# Patient Record
Sex: Female | Born: 1980
Health system: Southern US, Community
[De-identification: ages and names within clinical notes are randomized; demographics above are authoritative.]

## PROBLEM LIST (undated history)

## (undated) DIAGNOSIS — J45909 Unspecified asthma, uncomplicated: Secondary | ICD-10-CM

## (undated) DIAGNOSIS — O09519 Supervision of elderly primigravida, unspecified trimester: Secondary | ICD-10-CM

## (undated) DIAGNOSIS — B009 Herpesviral infection, unspecified: Secondary | ICD-10-CM

## (undated) DIAGNOSIS — I2699 Other pulmonary embolism without acute cor pulmonale: Secondary | ICD-10-CM

## (undated) HISTORY — DX: Supervision of elderly primigravida, unspecified trimester: O09.519

## (undated) HISTORY — DX: Unspecified asthma, uncomplicated: J45.909

---

## 2004-09-30 ENCOUNTER — Other Ambulatory Visit: Admission: RE | Admit: 2004-09-30 | Discharge: 2004-09-30 | Payer: Self-pay | Admitting: Obstetrics & Gynecology

## 2005-10-22 ENCOUNTER — Other Ambulatory Visit: Admission: RE | Admit: 2005-10-22 | Discharge: 2005-10-22 | Payer: Self-pay | Admitting: Obstetrics & Gynecology

## 2007-03-06 ENCOUNTER — Ambulatory Visit (HOSPITAL_COMMUNITY): Admission: RE | Admit: 2007-03-06 | Discharge: 2007-03-06 | Payer: Self-pay | Admitting: Gastroenterology

## 2010-02-24 ENCOUNTER — Emergency Department (HOSPITAL_COMMUNITY): Admission: EM | Admit: 2010-02-24 | Discharge: 2010-02-24 | Payer: Self-pay | Admitting: Family Medicine

## 2010-10-04 ENCOUNTER — Encounter: Payer: Self-pay | Admitting: Gastroenterology

## 2011-08-25 ENCOUNTER — Emergency Department (HOSPITAL_COMMUNITY)
Admission: EM | Admit: 2011-08-25 | Discharge: 2011-08-25 | Disposition: A | Discharge status: Home / Self Care | Payer: 59 | Source: Home / Self Care | Attending: Emergency Medicine | Admitting: Emergency Medicine

## 2011-08-25 ENCOUNTER — Encounter: Payer: Self-pay | Admitting: Emergency Medicine

## 2011-08-25 DIAGNOSIS — R6889 Other general symptoms and signs: Secondary | ICD-10-CM

## 2011-08-25 HISTORY — DX: Herpesviral infection, unspecified: B00.9

## 2011-08-25 MED ORDER — FLUTICASONE PROPIONATE 50 MCG/ACT NA SUSP: 2.0000 | Freq: Every day | NASAL | Status: DC

## 2011-08-25 MED ORDER — IBUPROFEN 600 MG PO TABS: 600.0000 mg | ORAL_TABLET | Freq: Four times a day (QID) | ORAL | Status: AC | PRN

## 2011-08-25 MED ORDER — HYDROCODONE-ACETAMINOPHEN 7.5-500 MG/15ML PO SOLN: 5.0000 mL | Freq: Four times a day (QID) | ORAL | Status: AC | PRN

## 2011-08-25 NOTE — ED Notes (Signed)
Pt here with fever 101.8 that statrted on Saturday after watching niece with flu.sx on Monday cough all through the night,chest congestion and sore throat.pt has been using dayquil,nyquil and aleve.afebrile today

## 2011-08-25 NOTE — ED Provider Notes (Signed)
History     CSN: [id number] Arrival date & time: 08/25/2011  8:47 AM   First MD Initiated Contact with Patient 08/25/11 0912      Chief Complaint  Patient presents with  . URI    HPI Comments: HPI : Flu symptoms for about 2 day. Fever to 101.8 with chills, sweats, myalgias, fatigue, headache, nonproductive cough, rhinorrhea. wheezing worse at night. Unable to sleep secondary to coughing. Symptoms are progressively worsening, despite trying OTC fever reducing medicine and rest and fluids. Has decreased appetite, but tolerating some liquids by mouth. 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 Did not get flu shot this year.   Review of Systems: Positive for fatigue, mild nasal congestion, mild sore throat, mild swollen anterior neck glands, mild cough. Negative for acute vision changes, stiff neck, focal weakness, syncope, seizures, respiratory distress, abd pain, vomiting, diarrhea, GU symptoms.   Patient is a 30 y.o. female presenting with URI. The history is provided by the patient.  URI    Past Medical History  Diagnosis Date  . Herpes     History reviewed. No pertinent past surgical history.  History reviewed. No pertinent family history.  History  Substance Use Topics  . Smoking status: Current Everyday Smoker  . Smokeless tobacco: Not on file  . Alcohol Use: Yes    OB History    Grav Para Term Preterm Abortions TAB SAB Ect Mult Living                  Review of Systems  Allergies  Aspirin  Home Medications   Current Outpatient Rx  Name Route Sig Dispense Refill  . VALACYCLOVIR HCL 500 MG PO TABS Oral Take 500 mg by mouth daily.      Marland Kitchen FLUTICASONE PROPIONATE 50 MCG/ACT NA SUSP Nasal Place 2 sprays into the nose daily. 16 g 2  . HYDROCODONE-ACETAMINOPHEN 7.5-500 MG/15ML PO SOLN Oral Take 5 mLs by mouth every 6 (six) hours as needed for pain. 120 mL 0  . IBUPROFEN 600 MG PO TABS Oral Take 1 tablet (600 mg total) by mouth every 6 (six)  hours as needed for pain. 30 tablet 0    BP 130/78  Pulse 55  Temp(Src) 98.4 F (36.9 C) (Oral)  Resp 18  SpO2 100%  LMP 08/25/2011  Physical Exam  Nursing note and vitals reviewed. Constitutional: She is oriented to person, place, and time. She appears well-developed and well-nourished.  HENT:  Head: Normocephalic and atraumatic.  Right Ear: Tympanic membrane and ear canal normal.  Left Ear: Tympanic membrane and ear canal normal.  Nose: Mucosal edema and rhinorrhea present. No epistaxis.  Mouth/Throat: Uvula is midline and mucous membranes are normal. Posterior oropharyngeal erythema present. No oropharyngeal exudate.       (-) frontal, maxillary sinus tenderness  Eyes: Conjunctivae and EOM are normal. Pupils are equal, round, and reactive to light.  Neck: Normal range of motion. Neck supple.  Cardiovascular: Normal rate, regular rhythm and normal heart sounds.   Pulmonary/Chest: Effort normal and breath sounds normal. No respiratory distress. She has no wheezes. She has no rales.  Abdominal: Soft. Bowel sounds are normal. She exhibits no distension. There is no tenderness. There is no rebound and no guarding.  Musculoskeletal: Normal range of motion.  Lymphadenopathy:       Shotty LN bilaterally  Neurological: She is alert and oriented to person, place, and time.  Skin: Skin is warm and dry. No rash noted.  Psychiatric: She has a normal mood and affect. Her behavior is normal. Judgment and thought content normal.    ED Course  Procedures (including critical care time)  Labs Reviewed - No data to display No results found.   1. Flu-like symptoms       MDM    Luiz Blare, MD 08/25/11 1024

## 2011-09-14 DIAGNOSIS — I2699 Other pulmonary embolism without acute cor pulmonale: Secondary | ICD-10-CM

## 2011-09-14 HISTORY — DX: Other pulmonary embolism without acute cor pulmonale: I26.99

## 2012-06-09 ENCOUNTER — Encounter (HOSPITAL_COMMUNITY): Payer: Self-pay | Admitting: Physical Medicine and Rehabilitation

## 2012-06-09 ENCOUNTER — Emergency Department (HOSPITAL_COMMUNITY)
Admission: EM | Admit: 2012-06-09 | Discharge: 2012-06-09 | Disposition: A | Discharge status: Nursing Home (Includes ICF) | Payer: 59 | Source: Home / Self Care

## 2012-06-09 ENCOUNTER — Inpatient Hospital Stay (HOSPITAL_COMMUNITY)
Admission: EM | Admit: 2012-06-09 | Discharge: 2012-06-12 | DRG: 299 | Disposition: A | Discharge status: Home / Self Care | Payer: 59 | Attending: Internal Medicine | Admitting: Internal Medicine

## 2012-06-09 ENCOUNTER — Encounter (HOSPITAL_COMMUNITY): Payer: Self-pay | Admitting: *Deleted

## 2012-06-09 ENCOUNTER — Emergency Department (HOSPITAL_COMMUNITY): Payer: 59

## 2012-06-09 DIAGNOSIS — F17200 Nicotine dependence, unspecified, uncomplicated: Secondary | ICD-10-CM | POA: Diagnosis present

## 2012-06-09 DIAGNOSIS — Z886 Allergy status to analgesic agent status: Secondary | ICD-10-CM

## 2012-06-09 DIAGNOSIS — Z832 Family history of diseases of the blood and blood-forming organs and certain disorders involving the immune mechanism: Secondary | ICD-10-CM

## 2012-06-09 DIAGNOSIS — I82409 Acute embolism and thrombosis of unspecified deep veins of unspecified lower extremity: Principal | ICD-10-CM | POA: Diagnosis present

## 2012-06-09 DIAGNOSIS — M7989 Other specified soft tissue disorders: Secondary | ICD-10-CM

## 2012-06-09 DIAGNOSIS — R609 Edema, unspecified: Secondary | ICD-10-CM

## 2012-06-09 DIAGNOSIS — R600 Localized edema: Secondary | ICD-10-CM

## 2012-06-09 DIAGNOSIS — T384X5A Adverse effect of oral contraceptives, initial encounter: Secondary | ICD-10-CM | POA: Diagnosis present

## 2012-06-09 DIAGNOSIS — Y92009 Unspecified place in unspecified non-institutional (private) residence as the place of occurrence of the external cause: Secondary | ICD-10-CM

## 2012-06-09 DIAGNOSIS — I2699 Other pulmonary embolism without acute cor pulmonale: Secondary | ICD-10-CM | POA: Diagnosis present

## 2012-06-09 DIAGNOSIS — M79609 Pain in unspecified limb: Secondary | ICD-10-CM

## 2012-06-09 DIAGNOSIS — M79662 Pain in left lower leg: Secondary | ICD-10-CM

## 2012-06-09 LAB — CBC WITH DIFFERENTIAL/PLATELET
Basophils Relative: 1 % (ref 0–1)
HCT: 37 % (ref 36.0–46.0)
Hemoglobin: 12.7 g/dL (ref 12.0–15.0)
Lymphocytes Relative: 35 % (ref 12–46)
Lymphs Abs: 3.4 10*3/uL (ref 0.7–4.0)
MCHC: 34.3 g/dL (ref 30.0–36.0)
Monocytes Absolute: 1.1 10*3/uL — ABNORMAL HIGH (ref 0.1–1.0)
Monocytes Relative: 12 % (ref 3–12)
Neutro Abs: 5.1 10*3/uL (ref 1.7–7.7)
Neutrophils Relative %: 52 % (ref 43–77)
RBC: 4.04 MIL/uL (ref 3.87–5.11)

## 2012-06-09 LAB — BASIC METABOLIC PANEL
BUN: 8 mg/dL (ref 6–23)
Chloride: 101 mEq/L (ref 96–112)
GFR calc Af Amer: >90 mL/min (ref >90)
Glucose, Bld: 99 mg/dL (ref 70–99)
Potassium: 3.4 mEq/L — ABNORMAL LOW (ref 3.5–5.1)
Sodium: 136 mEq/L (ref 135–145)

## 2012-06-09 LAB — POCT PREGNANCY, URINE: Preg Test, Ur: NEGATIVE

## 2012-06-09 LAB — PROTIME-INR: INR: 1.03 (ref 0.00–1.49)

## 2012-06-09 MED ORDER — SODIUM CHLORIDE 0.9 % IV SOLN: INTRAVENOUS | Status: DC

## 2012-06-09 MED ORDER — IOHEXOL 350 MG/ML SOLN
100.0000 mL | Freq: Once | INTRAVENOUS | Status: AC | PRN
  Administered 2012-06-09: 100 mL via INTRAVENOUS

## 2012-06-09 MED ORDER — ENOXAPARIN SODIUM 60 MG/0.6ML ~~LOC~~ SOLN
1.0000 mg/kg | Freq: Once | SUBCUTANEOUS | Status: AC
  Administered 2012-06-09: 75 mg via SUBCUTANEOUS
  Filled 2012-06-09: qty 1.2

## 2012-06-09 NOTE — Progress Notes (Addendum)
*  PRELIMINARY RESULTS* Vascular Ultrasound Bilateral lower extremity venous duplex has been completed.  Preliminary findings: Right= no evidence of DVT. Left = Positive for DVT involving the left popliteal vein and left peroneal veins. Superficial thrombosis involving a branch off the left peroneal veins.   Called report to Dr. Fonnie Jarvis in ED.  Farrel Demark, RDMS, RVT  06/09/2012, 4:08 PM

## 2012-06-09 NOTE — ED Notes (Signed)
Updated on plan of care and wait time.

## 2012-06-09 NOTE — ED Notes (Signed)
Spoke with Dr. Fonnie Jarvis, obtained verbal order for venous study. pt updated on plan of care.

## 2012-06-09 NOTE — ED Provider Notes (Signed)
History     CSN: [id number]  Arrival date & time 06/09/12  1415   First MD Initiated Contact with Patient 06/09/12 1918      Chief Complaint  Patient presents with  . Leg Pain     HPI  The patient presents via urgent care, after initially presenting with concerns of left leg pain and swelling.  Symptoms have been intermittent, but more persistent over the past few days.  She notes pain is worse with motion, though intermittently and inconsistently better and worse with no clear precipitating or exacerbating/alleviating factors. On additional interviewing, the patient notes she also has had intermittent chest pain, decreased exercise capacity, intermittent dyspnea.  She denies exertional chest pain, states that the pain is anterior, nonradiating, sore. The patient smokes, uses oral contraceptives.  Past Medical History  Diagnosis Date  . Herpes     No past surgical history on file.  Family History  Problem Relation Age of Onset  . Cancer Mother     History  Substance Use Topics  . Smoking status: Light Tobacco Smoker -- 0.5 packs/day    Types: Cigarettes  . Smokeless tobacco: Not on file  . Alcohol Use: Yes    OB History    Grav Para Term Preterm Abortions TAB SAB Ect Mult Living                  Review of Systems  Constitutional:       HPI  HENT:       HPI otherwise negative  Eyes: Negative.   Respiratory:       HPI, otherwise negative  Cardiovascular:       HPI, otherwise nmegative  Gastrointestinal: Negative for vomiting.  Genitourinary:       HPI, otherwise negative  Musculoskeletal:       HPI, otherwise negative  Skin: Negative.   Neurological: Negative for syncope.    Allergies  Aspirin  Home Medications   Current Outpatient Rx  Name Route Sig Dispense Refill  . FLUTICASONE PROPIONATE 50 MCG/ACT NA SUSP Nasal Place 2 sprays into the nose daily.    . IBUPROFEN 200 MG PO TABS Oral Take 200 mg by mouth every 6 (six) hours as needed. For pain.     Marland Kitchen LEVONORGESTREL-ETHINYL ESTRAD 0.15-30 MG-MCG PO TABS Oral Take 1 tablet by mouth daily.    Marland Kitchen VALACYCLOVIR HCL 500 MG PO TABS Oral Take 500 mg by mouth daily.        BP 115/61  Pulse 72  Temp 98.4 F (36.9 C) (Oral)  Resp 19  Wt 163 lb (73.936 kg)  SpO2 100%  LMP 05/26/2012  Physical Exam  Nursing note and vitals reviewed. Constitutional: She is oriented to person, place, and time. She appears well-developed and well-nourished. No distress.  HENT:  Head: Normocephalic and atraumatic.  Eyes: Conjunctivae normal and EOM are normal.  Cardiovascular: Normal rate and regular rhythm.   Pulmonary/Chest: Effort normal and breath sounds normal. No stridor. No respiratory distress.  Abdominal: She exhibits no distension.  Musculoskeletal: She exhibits no edema.       Legs: Neurological: She is alert and oriented to person, place, and time. No cranial nerve deficit.  Skin: Skin is warm and dry.  Psychiatric: She has a normal mood and affect.    ED Course  Procedures (including critical care time)  Labs Reviewed  BASIC METABOLIC PANEL - Abnormal; Notable for the following:    Potassium 3.4 (*)     All other  components within normal limits  CBC WITH DIFFERENTIAL - Abnormal; Notable for the following:    Monocytes Absolute 1.1 (*)     All other components within normal limits  PROTIME-INR  POCT PREGNANCY, URINE   Ct Angio Chest Pe W/cm &/or Wo Cm  06/09/2012  *RADIOLOGY REPORT*  Clinical Data: Chest pain, newly diagnosed DVT  CT ANGIOGRAPHY CHEST  Technique:  Multidetector CT imaging of the chest using the standard protocol during bolus administration of intravenous contrast. Multiplanar reconstructed images including MIPs were obtained and reviewed to evaluate the vascular anatomy.  Contrast: OMNIPAQUE IOHEXOL 350 MG/ML SOLN  Comparison: None.  Findings: Segmental/subsegmental pulmonary emboli throughout all lobes, most prominent at the origin of the right upper lobe  pulmonary artery and the bifurcations of the bilateral lower lobe pulmonary arteries. Overall clot burden is moderate.  The lungs are clear.  2 mm nodule in the lingula (series 5/image 40), almost certainly benign. No pleural effusion or pneumothorax.  Visualized thyroid is unremarkable.  The heart is normal in size.  No pericardial effusion.  No suspicious mediastinal, hilar, or axillary lymphadenopathy.  Visualized upper abdomen is unremarkable.  Visualized osseous structures are within normal limits.  IMPRESSION: Segmental/subsegmental pulmonary emboli throughout all lobes. Overall clot burden is moderate.  Critical Value/emergent results were called by telephone at the time of interpretation on 06/09/2012 at 2140 hours to Dr. Gerhard Munch, who verbally acknowledged these results.   Original Report Authenticated By: Charline Bills, M.D.      No diagnosis found.  Update: I discussed the [place] results with the patient, at which point she described her CP.  Given the CP, a CTA was performed.  Update: Patient informed of CTA results.  She remains HD stable.  Cardiac: 65sr, normal O2: 99%ra, normal    MDM   this generally well-appearing young female now presents with concerns of occasional chest pain, left lower extremity pain.  On exam she is in no distress.  Chest clear lung sounds, stable vital signs.  She does have mild tenderness to palpation about the left lower extremity.  Patient's evaluation is notable for demonstration of deep vein thrombosis and pulmonary emboli.  The patient remained hemodynamic stable, in no distress throughout her ED stay.  She was admitted for further evaluation and management after initial provision of Lovenox.    Gerhard Munch, MD 06/10/12 0003

## 2012-06-09 NOTE — ED Notes (Signed)
Pt presents to department for evaluation of L lower leg swelling. Ongoing x1 day. Was sent from [facility] for rule out of possible DVT. Denies pain at the time. L lower leg noted to be swollen. Pt does take birth control and is a smoker. She is conscious alert and oriented x4. No signs of distress noted at the time.

## 2012-06-09 NOTE — H&P (Signed)
PCP:  None   Chief Complaint:  Leg swelling  HPI: Desiree Reed is a 31 y.o. female   has a past medical history of Herpes.   Presented with  Leg swelling and pain left leg worse than right over 2 weeks. She runs a lot and thought the pain was due to time. 3 months ago she used to travel a lot for her work but haven't been done so recently. The pain has become severe in the past few days but then went away. The swelling has persisted and brought her to emergency department. Last week she had some chest tightness and shortness of breath.but attributed this to stress. Her maternal grandfather had a history of blood clots. Her mother has history of a very incurred a cancer but has been tested for BRCA  gene and was negative. She has been taking birth control continues to smoke. He  Review of Systems:    Pertinent positives include:intentional weight loss , leg pain and swelling, shortness of breath at rest.   Constitutional:  No weight loss, night sweats, Fevers, chills, fatigue,  HEENT:  No headaches, Difficulty swallowing,Tooth/dental problems,Sore throat,  No sneezing, itching, ear ache, nasal congestion, post nasal drip,  Cardio-vascular:  No chest pain, Orthopnea, PND, anasarca, dizziness, palpitations.no Bilateral lower extremity swelling  GI:  No heartburn, indigestion, abdominal pain, nausea, vomiting, diarrhea, change in bowel habits, loss of appetite, melena, blood in stool, hematemesis Resp:   No dyspnea on exertion, No excess mucus, no productive cough, No non-productive cough, No coughing up of blood.No change in color of mucus.No wheezing. Skin:  no rash or lesions. No jaundice GU:  no dysuria, change in color of urine, no urgency or frequency. No straining to urinate.  No flank pain.  Musculoskeletal:  No joint pain or no joint swelling. No decreased range of motion. No back pain.  Psych:  No change in mood or affect. No depression or anxiety. No memory loss.    Neuro: no localizing neurological complaints, no tingling, no weakness, no double vision, no gait abnormality, no slurred speech, no confusion  Otherwise ROS are negative except for above, 10 systems were reviewed  Past Medical History: Past Medical History  Diagnosis Date  . Herpes    History reviewed. No pertinent past surgical history.   Medications: Prior to Admission medications   Medication Sig Start Date End Date Taking? Authorizing Provider  fluticasone (FLONASE) 50 MCG/ACT nasal spray Place 2 sprays into the nose daily.   Yes Historical Provider, MD  ibuprofen (ADVIL,MOTRIN) 200 MG tablet Take 200 mg by mouth every 6 (six) hours as needed. For pain.   Yes Historical Provider, MD  levonorgestrel-ethinyl estradiol (PORTIA-28) 0.15-30 MG-MCG tablet Take 1 tablet by mouth daily.   Yes Historical Provider, MD  valACYclovir (VALTREX) 500 MG tablet Take 500 mg by mouth daily.     Yes Historical Provider, MD    Allergies:   Allergies  Allergen Reactions  . Aspirin     Remote h/o allergy. Taking nsaids w/o problem    Social History:  Ambulatory  independently  Lives at home   reports that she has been smoking Cigarettes.  She has been smoking about .25 packs per day. She does not have any smokeless tobacco history on file. She reports that she drinks alcohol. She reports that she does not use illicit drugs.   Family History: family history includes Cancer in her mother and Ovarian cancer in her mother.    Physical Exam:  Patient Vitals for the past 24 hrs:  BP Temp Temp src Pulse Resp SpO2 Weight  06/09/12 2300 109/65 mmHg - - 62  - 98 % -  06/09/12 2230 116/66 mmHg - - 84  - 99 % -  06/09/12 2200 119/72 mmHg - - 61  - 100 % -  06/09/12 2155 115/61 mmHg - - 72  19  100 % 73.936 kg (163 lb)  06/09/12 2100 110/70 mmHg - - 63  - 100 % -  06/09/12 2030 108/67 mmHg - - 58  - 99 % -  06/09/12 2003 132/79 mmHg - - 62  14  100 % -  06/09/12 1847 122/83 mmHg 98.4 F (36.9  C) Oral 60  16  97 % -  06/09/12 1432 113/78 mmHg 98.6 F (37 C) Oral 59  18  97 % -    1. General:  in No Acute distress 2. Psychological: Alert and  Oriented 3. Head/ENT:   Moist  Dry Mucous Membranes                          Head Non traumatic, neck supple                          Normal Dentition 4. SKIN: normal   Skin turgor,  Skin clean Dry and intact no rash 5. Heart: Regular rate and rhythm no Murmur, Rub or gallop 6. Lungs: Clear to auscultation bilaterally, no wheezes or crackles   7. Abdomen: Soft, non-tender, Non distended 8. Lower extremities: no clubbing, cyanosis, 1+ edema bilaterally.  9. Neurologically Grossly intact, moving all 4 extremities equally 10. MSK: Normal range of motion  body mass index is unknown because there is no height on file.   Labs on Admission:   [facility] 06/09/12 1720  NA 136  K 3.4*  CL 101  CO2 27  GLUCOSE 99  BUN 8  CREATININE 0.58  CALCIUM 9.1  MG --  PHOS --   No results found for this basename: AST:2,ALT:2,ALKPHOS:2,BILITOT:2,PROT:2,ALBUMIN:2 in the last 72 hours No results found for this basename: LIPASE:2,AMYLASE:2 in the last 72 hours  Basename 06/09/12 1720  WBC 9.8  NEUTROABS 5.1  HGB 12.7  HCT 37.0  MCV 91.6  PLT 193   No results found for this basename: CKTOTAL:3,CKMB:3,CKMBINDEX:3,TROPONINI:3 in the last 72 hours No results found for this basename: TSH,T4TOTAL,FREET3,T3FREE,THYROIDAB in the last 72 hours No results found for this basename: VITAMINB12:2,FOLATE:2,FERRITIN:2,TIBC:2,IRON:2,RETICCTPCT:2 in the last 72 hours No results found for this basename: HGBA1C    CrCl is unknown because there is no height on file for the current visit.  Cultures: No results found for this basename: sdes, specrequest, cult, reptstatus       Radiological Exams on Admission: Ct Angio Chest Pe W/cm &/or Wo Cm  06/09/2012  *RADIOLOGY REPORT*  Clinical Data: Chest pain, newly diagnosed DVT  CT ANGIOGRAPHY CHEST  Technique:   Multidetector CT imaging of the chest using the standard protocol during bolus administration of intravenous contrast. Multiplanar reconstructed images including MIPs were obtained and reviewed to evaluate the vascular anatomy.  Contrast: OMNIPAQUE IOHEXOL 350 MG/ML SOLN  Comparison: None.  Findings: Segmental/subsegmental pulmonary emboli throughout all lobes, most prominent at the origin of the right upper lobe pulmonary artery and the bifurcations of the bilateral lower lobe pulmonary arteries. Overall clot burden is moderate.  The lungs are clear.  2 mm nodule in the lingula (  series 5/image 40), almost certainly benign. No pleural effusion or pneumothorax.  Visualized thyroid is unremarkable.  The heart is normal in size.  No pericardial effusion.  No suspicious mediastinal, hilar, or axillary lymphadenopathy.  Visualized upper abdomen is unremarkable.  Visualized osseous structures are within normal limits.  IMPRESSION: Segmental/subsegmental pulmonary emboli throughout all lobes. Overall clot burden is moderate.  Critical Value/emergent results were called by telephone at the time of interpretation on 06/09/2012 at 2140 hours to Dr. Gerhard Munch, who verbally acknowledged these results.   Original Report Authenticated By: Charline Bills, M.D.     Chart has been reviewed  Assessment/Plan  31 year old with  significant risk factors including smoking and uses a contraceptives here with DVT and PE  Present on Admission:  .DVT (deep venous thrombosis) - as per DVT protocol will initiate Lovenox and Coumadin .PE (pulmonary embolism) - currently hemodynamically stable will give IV fluids monitor on telemetry obtain troponin and cardiac echo gram to evaluate for any evidence of strain. I suspect that the pulmonary embolism has occurred actually weeks ago. Will order a hypercoagulable panel which should be followed up as an outpatient.    Prophylaxis:   Lovenox will start on Coumadin  CODE  STATUS: FULL CODE  Other plan as per orders.  I have spent a total of 55  min on this admission  Arihaan Bellucci 06/09/2012, 11:48 PM

## 2012-06-09 NOTE — ED Provider Notes (Signed)
History     CSN: [id number]  Arrival date & time 06/09/12  1226   None     Chief Complaint  Patient presents with  . Leg Swelling    (Consider location/radiation/quality/duration/timing/severity/associated sxs/prior treatment) HPI Comments: 31 year old female who developed intermittent left calf pain 2 weeks ago. Last night she developed edema in the left lower extremity. Although she has some discomfort in the calf most of the time, several days ago she had severe calf pain. It was associated with hiking and running. The right leg is unaffected. She does have risk factors of is taking OCPs and is a smoker.   Past Medical History  Diagnosis Date  . Herpes     History reviewed. No pertinent past surgical history.  Family History  Problem Relation Age of Onset  . Cancer Mother     History  Substance Use Topics  . Smoking status: Light Tobacco Smoker -- 0.5 packs/day    Types: Cigarettes  . Smokeless tobacco: Not on file  . Alcohol Use: Yes    OB History    Grav Para Term Preterm Abortions TAB SAB Ect Mult Living                  Review of Systems  Constitutional: Negative for fever, chills and activity change.  HENT: Negative.   Respiratory: Negative.   Cardiovascular: Negative.   Gastrointestinal: Negative.   Musculoskeletal: Positive for myalgias. Negative for back pain, joint swelling and arthralgias.       As per HPI  Skin: Negative for color change, pallor and rash.  Neurological: Negative.     Allergies  Aspirin  Home Medications   Current Outpatient Rx  Name Route Sig Dispense Refill  . FLUTICASONE PROPIONATE 50 MCG/ACT NA SUSP Nasal Place 2 sprays into the nose daily. 16 g 2  . VALACYCLOVIR HCL 500 MG PO TABS Oral Take 500 mg by mouth daily.        BP 101/57  Pulse 67  Temp 98.2 F (36.8 C) (Oral)  Resp 18  SpO2 97%  LMP 05/26/2012  Physical Exam  Constitutional: She is oriented to person, place, and time. She appears well-developed  and well-nourished. No distress.  Neck: Normal range of motion. Neck supple.  Cardiovascular: Normal rate and normal heart sounds.   Pulmonary/Chest: Effort normal and breath sounds normal. No respiratory distress.  Musculoskeletal: Normal range of motion. She exhibits edema and tenderness.       There is mild 1+ pitting edema to the left lower extremity him a pitting occurs directly over the shin and cannot appreciated elsewhere. Mild tenderness to the left calf, today the discomfort is much improved. Negative Homans  Neurological: She is alert and oriented to person, place, and time. No cranial nerve deficit.  Skin: Skin is warm and dry.  Psychiatric: She has a normal mood and affect.    ED Course  Procedures (including critical care time)  Labs Reviewed - No data to display No results found.   1. Pain of left calf   2. Leg edema       MDM  Transferred to the emergency department consider DVT. Risk factors include female, smoking, OCPs. Once DVT has been ruled out can consider muscular skeletal causes.        Hayden Rasmussen, NP 06/09/12 [phone]

## 2012-06-09 NOTE — ED Notes (Signed)
Patient transported to CT 

## 2012-06-09 NOTE — ED Provider Notes (Signed)
Medical screening examination/treatment/procedure(s) were performed by non-physician practitioner and as supervising physician I was immediately available for consultation/collaboration.  Leslee Home, M.D.   Reuben Likes, MD 06/09/12 2242

## 2012-06-09 NOTE — ED Notes (Signed)
Pt reports deep muscle like pain that started on Saturday - crampy like feeling, unable to apply weight - right leg has been sore ever since today states that leg is extremely swollen and red

## 2012-06-10 ENCOUNTER — Encounter (HOSPITAL_COMMUNITY): Payer: Self-pay

## 2012-06-10 DIAGNOSIS — I82409 Acute embolism and thrombosis of unspecified deep veins of unspecified lower extremity: Principal | ICD-10-CM

## 2012-06-10 DIAGNOSIS — I369 Nonrheumatic tricuspid valve disorder, unspecified: Secondary | ICD-10-CM

## 2012-06-10 DIAGNOSIS — I2699 Other pulmonary embolism without acute cor pulmonale: Secondary | ICD-10-CM

## 2012-06-10 LAB — URINALYSIS, ROUTINE W REFLEX MICROSCOPIC
Leukocytes, UA: NEGATIVE
Nitrite: NEGATIVE
Specific Gravity, Urine: 1.01 (ref 1.005–1.030)
pH: 7 (ref 5.0–8.0)

## 2012-06-10 LAB — COMPREHENSIVE METABOLIC PANEL
ALT: 47 U/L — ABNORMAL HIGH (ref 0–35)
Albumin: 3.2 g/dL — ABNORMAL LOW (ref 3.5–5.2)
Alkaline Phosphatase: 63 U/L (ref 39–117)
BUN: 9 mg/dL (ref 6–23)
Chloride: 105 mEq/L (ref 96–112)
Glucose, Bld: 86 mg/dL (ref 70–99)
Potassium: 4 mEq/L (ref 3.5–5.1)
Sodium: 137 mEq/L (ref 135–145)
Total Bilirubin: 0.6 mg/dL (ref 0.3–1.2)

## 2012-06-10 LAB — CBC
MCHC: 35.9 g/dL (ref 30.0–36.0)
MCV: 91 fL (ref 78.0–100.0)
Platelets: 182 10*3/uL (ref 150–400)
RDW: 12.5 % (ref 11.5–15.5)
WBC: 7.6 10*3/uL (ref 4.0–10.5)

## 2012-06-10 LAB — URINE MICROSCOPIC-ADD ON

## 2012-06-10 LAB — ANTITHROMBIN III
AntiThromb III Func: 77 % (ref 75–120)
AntiThromb III Func: 76 % (ref 75–120)

## 2012-06-10 LAB — TROPONIN I: Troponin I: <0.3 ng/mL (ref <0.30)

## 2012-06-10 LAB — HCG, SERUM, QUALITATIVE: Preg, Serum: NEGATIVE

## 2012-06-10 MED ORDER — VALACYCLOVIR HCL 500 MG PO TABS
500.0000 mg | ORAL_TABLET | Freq: Every day | ORAL | Status: DC
  Administered 2012-06-10 – 2012-06-12 (×3): 500 mg via ORAL
  Filled 2012-06-10 (×3): qty 1

## 2012-06-10 MED ORDER — LIDOCAINE 5 % EX PTCH: 1.0000 | MEDICATED_PATCH | CUTANEOUS | Status: DC

## 2012-06-10 MED ORDER — ENOXAPARIN SODIUM 80 MG/0.8ML ~~LOC~~ SOLN
75.0000 mg | Freq: Two times a day (BID) | SUBCUTANEOUS | Status: DC
  Administered 2012-06-10: 75 mg via SUBCUTANEOUS
  Filled 2012-06-10 (×2): qty 0.8

## 2012-06-10 MED ORDER — WARFARIN SODIUM 7.5 MG PO TABS
7.5000 mg | ORAL_TABLET | ORAL | Status: AC
  Administered 2012-06-10: 7.5 mg via ORAL
  Filled 2012-06-10 (×2): qty 1

## 2012-06-10 MED ORDER — NICOTINE 21 MG/24HR TD PT24
21.0000 mg | MEDICATED_PATCH | Freq: Every day | TRANSDERMAL | Status: DC
  Filled 2012-06-10 (×3): qty 1

## 2012-06-10 MED ORDER — WARFARIN - PHARMACIST DOSING INPATIENT: Freq: Every day | Status: DC

## 2012-06-10 MED ORDER — POTASSIUM CHLORIDE CRYS ER 20 MEQ PO TBCR
20.0000 meq | EXTENDED_RELEASE_TABLET | Freq: Once | ORAL | Status: AC
  Administered 2012-06-10: 20 meq via ORAL
  Filled 2012-06-10: qty 1

## 2012-06-10 MED ORDER — SODIUM CHLORIDE 0.9 % IJ SOLN
3.0000 mL | Freq: Two times a day (BID) | INTRAMUSCULAR | Status: DC
  Administered 2012-06-10 – 2012-06-12 (×6): 3 mL via INTRAVENOUS

## 2012-06-10 MED ORDER — ALUM & MAG HYDROXIDE-SIMETH 200-200-20 MG/5ML PO SUSP: 30.0000 mL | Freq: Four times a day (QID) | ORAL | Status: DC | PRN

## 2012-06-10 MED ORDER — SODIUM CHLORIDE 0.9 % IV SOLN
INTRAVENOUS | Status: AC
  Administered 2012-06-10: 03:00:00 via INTRAVENOUS

## 2012-06-10 MED ORDER — ACETAMINOPHEN 325 MG PO TABS
650.0000 mg | ORAL_TABLET | Freq: Four times a day (QID) | ORAL | Status: DC | PRN
  Administered 2012-06-11: 650 mg via ORAL
  Filled 2012-06-10 (×2): qty 2

## 2012-06-10 MED ORDER — WARFARIN SODIUM 7.5 MG PO TABS
7.5000 mg | ORAL_TABLET | Freq: Once | ORAL | Status: AC
  Administered 2012-06-10: 7.5 mg via ORAL
  Filled 2012-06-10: qty 1

## 2012-06-10 MED ORDER — ENOXAPARIN SODIUM 120 MG/0.8ML ~~LOC~~ SOLN
115.0000 mg | SUBCUTANEOUS | Status: DC
  Administered 2012-06-10 – 2012-06-12 (×3): 115 mg via SUBCUTANEOUS
  Filled 2012-06-10 (×3): qty 0.8

## 2012-06-10 MED ORDER — ONDANSETRON HCL 4 MG PO TABS: 4.0000 mg | ORAL_TABLET | Freq: Four times a day (QID) | ORAL | Status: DC | PRN

## 2012-06-10 MED ORDER — ACETAMINOPHEN 650 MG RE SUPP: 650.0000 mg | Freq: Four times a day (QID) | RECTAL | Status: DC | PRN

## 2012-06-10 MED ORDER — DOCUSATE SODIUM 100 MG PO CAPS
100.0000 mg | ORAL_CAPSULE | Freq: Two times a day (BID) | ORAL | Status: DC
  Administered 2012-06-10 – 2012-06-12 (×5): 100 mg via ORAL
  Filled 2012-06-10 (×6): qty 1

## 2012-06-10 MED ORDER — ONDANSETRON HCL 4 MG/2ML IJ SOLN: 4.0000 mg | Freq: Four times a day (QID) | INTRAMUSCULAR | Status: DC | PRN

## 2012-06-10 MED ORDER — HYDROCODONE-ACETAMINOPHEN 5-325 MG PO TABS: 1.0000 | ORAL_TABLET | ORAL | Status: DC | PRN

## 2012-06-10 MED ORDER — ACETAMINOPHEN 500 MG PO TABS: 500.0000 mg | ORAL_TABLET | Freq: Three times a day (TID) | ORAL | Status: DC

## 2012-06-10 NOTE — ED Notes (Signed)
Dr Adela Glimpse request change in bed to tele

## 2012-06-10 NOTE — Progress Notes (Signed)
ANTICOAGULATION CONSULT NOTE - Follow Up Consult  Pharmacy Consult for Lovenox and coumadin Indication: PE/DVT  Allergies  Allergen Reactions  . Aspirin     Remote h/o allergy. Taking nsaids w/o problem    Patient Measurements: Height: 5\' 8"  (172.7 cm) Weight: 169 lb 8.5 oz (76.9 kg) IBW/kg (Calculated) : 63.9    Vital Signs: Temp: 98.4 F (36.9 C) (09/28 0300) Temp src: Oral (09/28 0300) BP: 92/59 mmHg (09/28 0300) Pulse Rate: 64  (09/28 0300)  Labs:  Basename 06/10/12 0545 06/10/12 0535 06/09/12 1720  HGB -- 12.4 12.7  HCT -- 34.5* 37.0  PLT -- 182 193  APTT -- 36 --  LABPROT -- 13.5 13.4  INR -- 1.04 1.03  HEPARINUNFRC -- -- --  CREATININE -- 0.61 0.58  CKTOTAL -- -- --  CKMB -- -- --  TROPONINI <0.30 -- --    Estimated Creatinine Clearance: 112.2 ml/min (by C-G formula based on Cr of 0.61).   Assessment: Patient is a 31 y.o F on anticoagulation overlap day #1 of 5 days minimum for new DVT/PE.  Patient received 7.5mg  of coumadin this morning at 3 AM.  INR is subtherapeutic at 1.04.  No bleeding noted.  Goal of Therapy:  INR 2-3 Monitor platelets by anticoagulation protocol: Yes   Plan:  1) Coumadin 7.5mg  x1 at 10 PM tonight 2) Change lovenox to 115mg  SQ daily (1.5mg /kg/day) to reduce the number of shots patient receive per day.  Ylianna Almanzar P 06/10/2012,10:19 AM

## 2012-06-10 NOTE — Progress Notes (Signed)
  Echocardiogram 2D Echocardiogram has been performed.  Georgian Co 06/10/2012, 10:32 AM

## 2012-06-10 NOTE — Progress Notes (Signed)
Patient ID: Desiree Reed  female  ZOX:[id number]    DOB: [birth year]    DOA: 06/09/2012  PCP: Sheila Oats, MD  Subjective: No chest pain or shortness of breath, left lower extremity swelling improving. No fever, chills, cough, nausea or vomiting  Objective: Weight change:   Intake/Output Summary (Last 24 hours) at 06/10/12 1428 Last data filed at 06/10/12 1300  Gross per 24 hour  Intake    480 ml  Output   1500 ml  Net  -1020 ml   Blood pressure 108/66, pulse 74, temperature 99.1 F (37.3 C), temperature source Oral, resp. rate 18, height 5\' 8"  (1.727 m), weight 76.9 kg (169 lb 8.5 oz), last menstrual period 05/26/2012, SpO2 98.00%.  Physical Exam: General: Alert and awake, oriented x3, not in any acute distress. HEENT: anicteric sclera, pupils reactive to light and accommodation, EOMI CVS: S1-S2 clear, no murmur rubs or gallops Chest: clear to auscultation bilaterally, no wheezing, rales or rhonchi Abdomen: soft nontender, nondistended, normal bowel sounds, no organomegaly Extremities: no cyanosis, clubbing. Left calf tenderness mild, with swelling Neuro: Cranial nerves II-XII intact, no focal neurological deficits  Lab Results: Basic Metabolic Panel:  Lab 06/10/12 8119 06/09/12 1720  NA 137 136  K 4.0 3.4*  CL 105 101  CO2 25 27  GLUCOSE 86 99  BUN 9 8  CREATININE 0.61 0.58  CALCIUM 8.8 9.1  MG 1.9 --  PHOS 3.9 --   Liver Function Tests:  Lab 06/10/12 0535  AST 30  ALT 47*  ALKPHOS 63  BILITOT 0.6  PROT 5.9*  ALBUMIN 3.2*   No results found for this basename: LIPASE:2,AMYLASE:2 in the last 168 hours No results found for this basename: AMMONIA:2 in the last 168 hours CBC:  Lab 06/10/12 0535 06/09/12 1720  WBC 7.6 9.8  NEUTROABS -- 5.1  HGB 12.4 12.7  HCT 34.5* 37.0  MCV 91.0 91.6  PLT 182 193   Cardiac Enzymes:  Lab 06/10/12 0545  CKTOTAL --  CKMB --  CKMBINDEX --  TROPONINI <0.30    Studies/Results: Ct Angio Chest Pe W/cm &/or Wo  Cm  06/09/2012   IMPRESSION: Segmental/subsegmental pulmonary emboli throughout all lobes. Overall clot burden is moderate.  Critical Value/emergent results were called by telephone at the time of interpretation on 06/09/2012 at 2140 hours to Dr. Gerhard Munch, who verbally acknowledged these results.   Original Report Authenticated By: Charline Bills, M.D.     Medications: Scheduled Meds:   . sodium chloride   Intravenous STAT  . docusate sodium  100 mg Oral BID  . enoxaparin (LOVENOX) injection  115 mg Subcutaneous Q24H  . enoxaparin  1 mg/kg Subcutaneous Once  . potassium chloride  20 mEq Oral Once  . sodium chloride  3 mL Intravenous Q12H  . valACYclovir  500 mg Oral Daily  . warfarin  7.5 mg Oral NOW  . warfarin  7.5 mg Oral Once  . Warfarin - Pharmacist Dosing Inpatient   Does not apply q1800  . DISCONTD: sodium chloride   Intravenous STAT  . DISCONTD: acetaminophen  500 mg Oral TID  . DISCONTD: enoxaparin (LOVENOX) injection  75 mg Subcutaneous Q12H  . DISCONTD: lidocaine  1 patch Transdermal Q24H   Continuous Infusions:    Assessment/Plan: Principal Problem:  *DVT (deep venous thrombosis)bleft lower extremity with bilateral pulmonary embolism: moderate clot burden - Started on therapeutic Lovenox, Coumadin per pharmacy - Hypercoagulability panel ordered, needs to be repeated in 6-8 weeks ( per patient her grandfather has clotting  disorder, patient also was on birth control pill) - Patient will need Lovenox teaching, at least 5-7 days of Coumadin bridge.  - She does not have PCP, will set up appt on Monday - 2-D echo done today, await results to rule out any right heart strain or failure   Nicotine abuse: patient was counseled for nicotine cessation and nicotine patch provided  DVT Prophylaxis:on therapeutic Lovenox and Coumadin  Code Status:full code  Disposition: hopefully on Monday   LOS: 1 day   Dammon Makarewicz M.D. [facility] 06/10/2012,  2:28 PM Pager: [phone]  If 7PM-7AM, please contact night-coverage [email or web address] Password [email or web address]

## 2012-06-10 NOTE — Progress Notes (Signed)
ANTICOAGULATION CONSULT NOTE - Initial Consult  Pharmacy Consult for Lovenox/Coumadin Indication: LLE DVT and B/L PE  Allergies  Allergen Reactions  . Aspirin     Remote h/o allergy. Taking nsaids w/o problem    Patient Measurements: Weight: 163 lb (73.936 kg)  Vital Signs: Temp: 98.4 F (36.9 C) (09/27 1847) Temp src: Oral (09/27 1847) BP: 109/65 mmHg (09/27 2300) Pulse Rate: 62  (09/27 2300)  Labs:  Basename 06/09/12 1720  HGB 12.7  HCT 37.0  PLT 193  APTT --  LABPROT 13.4  INR 1.03  HEPARINUNFRC --  CREATININE 0.58  CKTOTAL --  CKMB --  TROPONINI --    CrCl is unknown because there is no height on file for the current visit.   Medical History: Past Medical History  Diagnosis Date  . Herpes     Medications:   (Not in a hospital admission)  Assessment: 31 y/o female patient admitted with leg pain and shortness of breath, found to have LLE DVT and B/L PE requiring anticoagulation. Baseline INR wnl, lovenox 75mg  given in ED at 2230.   Goal of Therapy:  INR 2-3 Anti-Xa level 0.6-1.2 units/ml 4hrs after LMWH dose given Monitor platelets by anticoagulation protocol: Yes   Plan:  Lovenox 75mg  sq q12h, coumadin 7.5mg  today and f/u daily protime. Will need minimum of 5 days overlap.  Verlene Mayer, PharmD, BCPS Pager [phone] 06/10/2012,1:39 AM

## 2012-06-11 LAB — CBC
MCH: 31 pg (ref 26.0–34.0)
Platelets: 176 10*3/uL (ref 150–400)
RBC: 4.06 MIL/uL (ref 3.87–5.11)
WBC: 7.2 10*3/uL (ref 4.0–10.5)

## 2012-06-11 LAB — BASIC METABOLIC PANEL
BUN: 8 mg/dL (ref 6–23)
Chloride: 106 mEq/L (ref 96–112)
Creatinine, Ser: 0.57 mg/dL (ref 0.50–1.10)
GFR calc Af Amer: >90 mL/min (ref >90)
GFR calc non Af Amer: >90 mL/min (ref >90)
Glucose, Bld: 87 mg/dL (ref 70–99)
Potassium: 4 mEq/L (ref 3.5–5.1)

## 2012-06-11 LAB — PROTIME-INR: Prothrombin Time: 13.4 seconds (ref 11.6–15.2)

## 2012-06-11 MED ORDER — WARFARIN SODIUM 7.5 MG PO TABS
7.5000 mg | ORAL_TABLET | Freq: Once | ORAL | Status: AC
  Administered 2012-06-11: 7.5 mg via ORAL
  Filled 2012-06-11: qty 1

## 2012-06-11 NOTE — Progress Notes (Signed)
Patient ID: Desiree Reed  female  ZOX:[id number]    DOB: May 15, 1981    DOA: 06/09/2012  PCP: Sheila Oats, MD  Subjective: No chest pain or shortness of breath, left lower extremity swelling improving.  Objective: Weight change:   Intake/Output Summary (Last 24 hours) at 06/11/12 1326 Last data filed at 06/11/12 1226  Gross per 24 hour  Intake      0 ml  Output   1400 ml  Net  -1400 ml   Blood pressure 107/60, pulse 60, temperature 98.6 F (37 C), temperature source Oral, resp. rate 19, height 5\' 8"  (1.727 m), weight 76.9 kg (169 lb 8.5 oz), last menstrual period 05/26/2012, SpO2 97.00%.  Physical Exam: General: Alert and awake, oriented x3, not in any acute distress. HEENT: anicteric sclera, pupils reactive to light and accommodation, EOMI CVS: S1-S2 clear, no murmur rubs or gallops Chest: clear to auscultation bilaterally, no wheezing, rales or rhonchi Abdomen: soft nontender, nondistended, normal bowel sounds, no organomegaly Extremities: no cyanosis, clubbing. Left calf tenderness mild, with swelling Neuro: Cranial nerves II-XII intact, no focal neurological deficits  Lab Results: Basic Metabolic Panel:  Lab 06/11/12 8119 06/10/12 0535  NA 139 137  K 4.0 4.0  CL 106 105  CO2 23 25  GLUCOSE 87 86  BUN 8 9  CREATININE 0.57 0.61  CALCIUM 9.1 8.8  MG -- 1.9  PHOS -- 3.9   Liver Function Tests:  Lab 06/10/12 0535  AST 30  ALT 47*  ALKPHOS 63  BILITOT 0.6  PROT 5.9*  ALBUMIN 3.2*   No results found for this basename: LIPASE:2,AMYLASE:2 in the last 168 hours No results found for this basename: AMMONIA:2 in the last 168 hours CBC:  Lab 06/11/12 0606 06/10/12 0535 06/09/12 1720  WBC 7.2 7.6 --  NEUTROABS -- -- 5.1  HGB 12.6 12.4 --  HCT 37.4 34.5* --  MCV 92.1 91.0 --  PLT 176 182 --   Cardiac Enzymes:  Lab 06/10/12 0545  CKTOTAL --  CKMB --  CKMBINDEX --  TROPONINI <0.30    Studies/Results: Ct Angio Chest Pe W/cm &/or Wo Cm  06/09/2012    IMPRESSION: Segmental/subsegmental pulmonary emboli throughout all lobes. Overall clot burden is moderate.  Critical Value/emergent results were called by telephone at the time of interpretation on 06/09/2012 at 2140 hours to Dr. Gerhard Munch, who verbally acknowledged these results.   Original Report Authenticated By: Charline Bills, M.D.     Medications: Scheduled Meds:    . sodium chloride   Intravenous STAT  . docusate sodium  100 mg Oral BID  . enoxaparin (LOVENOX) injection  115 mg Subcutaneous Q24H  . nicotine  21 mg Transdermal Daily  . sodium chloride  3 mL Intravenous Q12H  . valACYclovir  500 mg Oral Daily  . warfarin  7.5 mg Oral Once  . warfarin  7.5 mg Oral ONCE-1800  . Warfarin - Pharmacist Dosing Inpatient   Does not apply q1800   Continuous Infusions:    Assessment/Plan: Principal Problem:  *DVT (deep venous thrombosis)bleft lower extremity with bilateral pulmonary embolism: moderate clot burden - Started on therapeutic Lovenox, Coumadin per pharmacy - Hypercoagulability panel ordered, needs to be repeated in 6-8 weeks ( per patient her grandfather has clotting disorder, patient also was on birth control pill), homocystine level within normal limits - Patient will need Lovenox teaching, at least 5-7 days of Coumadin bridge.  - She does not have PCP, will set up appt on Monday - 2-D echo done:  EF 60-65%, no regional wall motion abnormalities  Nicotine abuse: patient was counseled for nicotine cessation and nicotine patch provided  DVT Prophylaxis:on therapeutic Lovenox and Coumadin  Code Status:full code  Disposition: hopefully on Monday- Tuesday   LOS: 2 days   Rajean Desantiago M.D. [facility] 06/11/2012, 1:26 PM Pager: [phone]  If 7PM-7AM, please contact night-coverage [email or web address] Password [email or web address]

## 2012-06-11 NOTE — Progress Notes (Signed)
Pt up and ambulated off unit at this time; pt c/o L leg pain; pt in bed now; L leg elevated; pt denies need for pain meds at this time; will cont. To monitor.

## 2012-06-11 NOTE — Progress Notes (Signed)
ANTICOAGULATION CONSULT NOTE - Follow Up Consult  Pharmacy Consult for lovenox and coumadin Indication: DVT/PE  Allergies  Allergen Reactions  . Aspirin     Remote h/o allergy. Taking nsaids w/o problem    Patient Measurements: Height: 5\' 8"  (172.7 cm) Weight: 169 lb 8.5 oz (76.9 kg) IBW/kg (Calculated) : 63.9     Vital Signs: Temp: 98.6 F (37 C) (09/29 0447) Temp src: Oral (09/29 0447) BP: 107/60 mmHg (09/29 0447) Pulse Rate: 60  (09/29 0447)  Labs:  Basename 06/11/12 0606 06/10/12 0545 06/10/12 0535 06/09/12 1720  HGB 12.6 -- 12.4 --  HCT 37.4 -- 34.5* 37.0  PLT 176 -- 182 193  APTT -- -- 36 --  LABPROT 13.4 -- 13.5 13.4  INR 1.03 -- 1.04 1.03  HEPARINUNFRC -- -- -- --  CREATININE 0.57 -- 0.61 0.58  CKTOTAL -- -- -- --  CKMB -- -- -- --  TROPONINI -- <0.30 -- --    Estimated Creatinine Clearance: 112.2 ml/min (by C-G formula based on Cr of 0.57).   Assessment: Patient is a 30 y.o F on anticoagulation overlap day #2 of 5 days minimum for PE/DVT.  INR is subtherapeutic. No bleeding noted.  Goal of Therapy:  INR 2-3 Monitor platelets by anticoagulation protocol: Yes   Plan:  1) repeat 7.5mg  at 10 PM tonight 2) cont lovenox to 115mg  daily (1.5mg /kg/day)  Arlester Marker, Gregorio Worley P 06/11/2012,11:05 AM

## 2012-06-12 LAB — CBC
HCT: 38.8 % (ref 36.0–46.0)
MCH: 31.2 pg (ref 26.0–34.0)
MCV: 93 fL (ref 78.0–100.0)
RBC: 4.17 MIL/uL (ref 3.87–5.11)
WBC: 6.7 10*3/uL (ref 4.0–10.5)

## 2012-06-12 LAB — LUPUS ANTICOAGULANT PANEL
DRVVT: 52.1 secs — ABNORMAL HIGH (ref <45.1)
Lupus Anticoagulant: NOT DETECTED
dRVVT Incubated 1:1 Mix: 41 secs (ref <45.1)

## 2012-06-12 LAB — CARDIOLIPIN ANTIBODIES, IGG, IGM, IGA
Anticardiolipin IgA: 3 APL U/mL — ABNORMAL LOW (ref <22)
Anticardiolipin IgA: 2 APL U/mL — ABNORMAL LOW (ref <22)
Anticardiolipin IgM: 5 MPL U/mL — ABNORMAL LOW (ref <11)

## 2012-06-12 LAB — BASIC METABOLIC PANEL
BUN: 12 mg/dL (ref 6–23)
CO2: 27 mEq/L (ref 19–32)
Calcium: 9.5 mg/dL (ref 8.4–10.5)
Chloride: 104 mEq/L (ref 96–112)
Creatinine, Ser: 0.69 mg/dL (ref 0.50–1.10)

## 2012-06-12 LAB — BETA-2-GLYCOPROTEIN I ABS, IGG/M/A: Beta-2 Glyco I IgG: 0 G Units (ref <20)

## 2012-06-12 LAB — PROTEIN C, TOTAL: Protein C, Total: 83 % (ref 72–160)

## 2012-06-12 LAB — PROTEIN S, TOTAL: Protein S Ag, Total: 87 % (ref 60–150)

## 2012-06-12 LAB — FACTOR 5 LEIDEN

## 2012-06-12 MED ORDER — ENOXAPARIN SODIUM 100 MG/ML ~~LOC~~ SOLN: 115.0000 mg | SUBCUTANEOUS | Status: DC

## 2012-06-12 MED ORDER — WARFARIN SODIUM 7.5 MG PO TABS: 7.5000 mg | ORAL_TABLET | Freq: Every day | ORAL | Status: DC

## 2012-06-12 MED ORDER — COUMADIN BOOK
Freq: Once | Status: AC
  Administered 2012-06-12: 17:00:00
  Filled 2012-06-12 (×2): qty 1

## 2012-06-12 MED ORDER — WARFARIN SODIUM 7.5 MG PO TABS
7.5000 mg | ORAL_TABLET | Freq: Once | ORAL | Status: AC
  Administered 2012-06-12: 7.5 mg via ORAL
  Filled 2012-06-12: qty 1

## 2012-06-12 NOTE — Discharge Summary (Signed)
Physician Discharge Summary  Patient ID: Desiree Reed MRN: [id number] DOB/AGE: 1981/08/20 31 y.o.  Admit date: 06/09/2012 Discharge date: 06/12/2012  Primary Care Physician:  [name], MD  Discharge Diagnoses:    .DVT (deep venous thrombosis) .PE (pulmonary embolism)bilateral with moderate clot burden possibly due to birth control pills  Consults:  none   Discharge Medications:   Medication List     As of 06/12/2012  2:15 PM    STOP taking these medications         PORTIA-28 0.15-30 MG-MCG tablet   Generic drug: levonorgestrel-ethinyl estradiol      TAKE these medications         enoxaparin 100 MG/ML injection   Commonly known as: LOVENOX   Inject 1.15 mLs (115 mg total) into the skin daily. X 5 days, stop when INR is stable above 2 per your doctor's instructions.      fluticasone 50 MCG/ACT nasal spray   Commonly known as: FLONASE   Place 2 sprays into the nose daily.      ibuprofen 200 MG tablet   Commonly known as: ADVIL,MOTRIN   Take 200 mg by mouth every 6 (six) hours as needed. For pain.      valACYclovir 500 MG tablet   Commonly known as: VALTREX   Take 500 mg by mouth daily.      warfarin 7.5 MG tablet   Commonly known as: COUMADIN   Take 1 tablet (7.5 mg total) by mouth daily. Dose to be adjusted according to PT/INR results by your doctor         Brief H and P: For complete details please refer to admission H and P, but in brief, patient is a 31 year old female who presented with leg swelling and pain in the left leg over 2 weeks. The patient had traveled a lot for her work 3 months before the admission. Pain had become severe in the past few days but swelling has persisted which brought her to the emergency room. She also complained of some chest tightness and shortness of breath.  Hospital Course:  DVT (deep venous thrombosis)bleft lower extremity with bilateral pulmonary embolism: moderate clot burden  Likely from birth control pills or  travel history recently. Patient was started on therapeutic Lovenox and Coumadin per pharmacy.  Hypercoagulability panel wasordered, needs to be repeated in 6-8 weeks ( per patient her grandfather has clotting disorder, patient also was on birth control pill). Patient was given Lovenox teaching and needs to complete at least 5 days of bridge with Coumadin. 2-D echo done: EF 60-65%, no regional wall motion abnormalities. Patient was set up with Dr. Morrison Old follow up on 06/14/12.  Nicotine abuse: patient was counseled for nicotine cessation and nicotine patch provided   DVT Prophylaxis:on therapeutic Lovenox and Coumadin  Code Status:full code   Day of Discharge BP 102/64  Pulse 54  Temp 98.2 F (36.8 C) (Oral)  Resp 20  Ht 5\' 8"  (1.727 m)  Wt 76.9 kg (169 lb 8.5 oz)  BMI 25.78 kg/m2  SpO2 99%  LMP 05/26/2012  Physical Exam: General: Alert and awake oriented x3 not in any acute distress. HEENT: anicteric sclera, pupils reactive to light and accommodation CVS: S1-S2 clear no murmur rubs or gallops Chest: clear to auscultation bilaterally, no wheezing rales or rhonchi Abdomen: soft nontender, nondistended, normal bowel sounds, no organomegaly Extremities: no cyanosis, clubbing or edema noted bilaterally Neuro: Cranial nerves II-XII intact, no focal neurological deficits   The results of significant  diagnostics from this hospitalization (including imaging, microbiology, ancillary and laboratory) are listed below for reference.    LAB RESULTS: Basic Metabolic Panel:  Lab 06/12/12 4782 06/11/12 0606 06/10/12 0535  NA 139 139 --  K 4.0 4.0 --  CL 104 106 --  CO2 27 23 --  GLUCOSE 83 87 --  BUN 12 8 --  CREATININE 0.69 0.57 --  CALCIUM 9.5 9.1 --  MG -- -- 1.9  PHOS -- -- 3.9   Liver Function Tests:  Lab 06/10/12 0535  AST 30  ALT 47*  ALKPHOS 63  BILITOT 0.6  PROT 5.9*  ALBUMIN 3.2*   No results found for this basename: LIPASE:2,AMYLASE:2 in the last 168 hours No  results found for this basename: AMMONIA:2 in the last 168 hours CBC:  Lab 06/12/12 0615 06/11/12 0606 06/09/12 1720  WBC 6.7 7.2 --  NEUTROABS -- -- 5.1  HGB 13.0 12.6 --  HCT 38.8 37.4 --  MCV 93.0 -- --  PLT 207 176 --   Cardiac Enzymes:  Lab 06/10/12 0545  CKTOTAL --  CKMB --  CKMBINDEX --  TROPONINI <0.30   Significant Diagnostic Studies:  Ct Angio Chest Pe W/cm &/or Wo Cm  06/09/2012  *RADIOLOGY REPORT*  Clinical Data: Chest pain, newly diagnosed DVT  CT ANGIOGRAPHY CHEST  Technique:  Multidetector CT imaging of the chest using the standard protocol during bolus administration of intravenous contrast. Multiplanar reconstructed images including MIPs were obtained and reviewed to evaluate the vascular anatomy.  Contrast: OMNIPAQUE IOHEXOL 350 MG/ML SOLN  Comparison: None.  Findings: Segmental/subsegmental pulmonary emboli throughout all lobes, most prominent at the origin of the right upper lobe pulmonary artery and the bifurcations of the bilateral lower lobe pulmonary arteries. Overall clot burden is moderate.  The lungs are clear.  2 mm nodule in the lingula (series 5/image 40), almost certainly benign. No pleural effusion or pneumothorax.  Visualized thyroid is unremarkable.  The heart is normal in size.  No pericardial effusion.  No suspicious mediastinal, hilar, or axillary lymphadenopathy.  Visualized upper abdomen is unremarkable.  Visualized osseous structures are within normal limits.  IMPRESSION: Segmental/subsegmental pulmonary emboli throughout all lobes. Overall clot burden is moderate.  Critical Value/emergent results were called by telephone at the time of interpretation on 06/09/2012 at 2140 hours to Dr. Gerhard Munch, who verbally acknowledged these results.   Original Report Authenticated By: Charline Bills, M.D.      Disposition and Follow-up:     Discharge Orders    Future Orders Please Complete By Expires   Diet - low sodium heart healthy       Increase activity slowly          DISPOSITION: home DIET: *heart healthy ACTIVITY: *as tolerated TESTS THAT NEED FOLLOW-UP PTINR  DISCHARGE FOLLOW-UP Follow-up Information    Follow up with [name], MD. On 06/14/2012. (at 1:30 PM for PT/INR labs, please arrive early. Dr Link Snuffer will decide f/u on Friday 06/16/12   )    Contact information:   [address] [place] [address] [phone]          Time spent on Discharge: 40 MINS  Signed:   Deontaye Civello M.D. [facility] 06/12/2012, 2:15 PM Pager: [phone]

## 2012-06-12 NOTE — Progress Notes (Signed)
Pt demonstrated self injection of Lovenox at this time.  Pt also verbalizes understanding of proper dosage and technique.  Pt dc'd at this time.

## 2012-06-12 NOTE — Progress Notes (Signed)
DC instructions given to pt at this time re:  F/u appts, diet, activity, s/s of problems, community resources, coumadin, and lovenox.  Pt verbalized understanding of all instructions.  Pt awaiting coumadin dose and Lovenox dose at 1700 per md request before leaving.  No s/s of any acute distress at this time.  Call bell in reach.  Pt's bf at bedside.

## 2012-06-12 NOTE — Care Management Note (Unsigned)
    Page 1 of 1   06/12/2012     11:02:17 AM   CARE MANAGEMENT NOTE 06/12/2012  Patient:  Desiree Reed, Desiree Reed   Account Number:  [id number]  Date Initiated:  06/12/2012  Documentation initiated by:  SIMMONS,[place] Searles  Subjective/Objective Assessment:   ADMITTED WITH DVT/ PE; LIVES AT HOME WITH SPOUSE; WAS IPTA.     Action/Plan:   DISCHARGE PLANNING INITIATED.   Anticipated DC Date:  06/13/2012   Anticipated DC Plan:  HOME/SELF CARE      DC Planning Services  CM consult      Choice offered to / List presented to:             Status of service:  In process, will continue to follow Medicare Important Message given?   (If response is "NO", the following Medicare IM given date fields will be blank) Date Medicare IM given:   Date Additional Medicare IM given:    Discharge Disposition:    Per UR Regulation:  Reviewed for med. necessity/level of care/duration of stay  If discussed at Long Length of Stay Meetings, dates discussed:    Comments:  06/12/12  1100  Bruce Mayers SIMMONS RN, BSN [phone] PCP APPT MADE FOR [facility] 06/14/12 AT 1:30PM WITH DR Link Snuffer- HE WILL ARRANGE NEXT APPT FOR FRI 06/16/12 PENDING PT/INR RESULTS; PT'S EMAIL: [email or web address]-  PT MUST REGISTER ONLINE PRIOR TO APPT (IT WILL TIME OUT SO MUST BE TODAY OR TOM); NCM WILL FOLLOW.  06/12/12  1029  Corday Wyka SIMMONS RN, BSN [phone] NCM WILL FOLLOW. BENEFITS CHECK IN PROGRESS FOR LOVENOX.

## 2012-06-15 ENCOUNTER — Telehealth: Payer: Self-pay | Admitting: Hematology and Oncology

## 2012-06-15 NOTE — Telephone Encounter (Signed)
C/D 06/15/12 for appt 06/16/12

## 2012-06-15 NOTE — Telephone Encounter (Signed)
S/W PT IN REF. TO  NP APPT. ON 06/16/12 @ 1:00 REF[address]. HOLWERDA DX-PE NO PACKET SENT OUT. PT COMING TOMORROW.

## 2012-06-16 ENCOUNTER — Ambulatory Visit (HOSPITAL_BASED_OUTPATIENT_CLINIC_OR_DEPARTMENT_OTHER): Payer: 59 | Admitting: Hematology and Oncology

## 2012-06-16 ENCOUNTER — Telehealth: Payer: Self-pay | Admitting: Hematology and Oncology

## 2012-06-16 ENCOUNTER — Encounter: Payer: Self-pay | Admitting: Hematology and Oncology

## 2012-06-16 ENCOUNTER — Ambulatory Visit: Payer: 59 | Admitting: Lab

## 2012-06-16 ENCOUNTER — Telehealth: Payer: Self-pay | Admitting: *Deleted

## 2012-06-16 ENCOUNTER — Ambulatory Visit: Payer: 59

## 2012-06-16 VITALS — BP 105/68 | HR 57 | Temp 97.3°F | Resp 20 | Ht 68.0 in | Wt 167.3 lb

## 2012-06-16 DIAGNOSIS — I2699 Other pulmonary embolism without acute cor pulmonale: Secondary | ICD-10-CM

## 2012-06-16 DIAGNOSIS — I82409 Acute embolism and thrombosis of unspecified deep veins of unspecified lower extremity: Secondary | ICD-10-CM

## 2012-06-16 NOTE — Progress Notes (Signed)
This office note has been dictated.

## 2012-06-16 NOTE — Telephone Encounter (Signed)
Gave pr appt for CT , also oral contrast , NPO 4 hours prior to scan, CT angio in December 2013 see MD in April 2014, pt sent to labs today

## 2012-06-16 NOTE — Progress Notes (Signed)
Dr.     [name]    -     Primary. Dr.     Glynda Jaeger      -       OB/GYN.  [facility]    On    [address].  Cell    Phone      [phone].

## 2012-06-16 NOTE — Telephone Encounter (Signed)
Called pt on cell phone and left message re:   Pt's  Co-pay  For  Xarelto  # 30  Is   $ 35.00  As per Arlys John, pharmacist @ [facility].

## 2012-06-19 NOTE — Progress Notes (Signed)
CC:   Desiree Penna, MD, Fax [phone] Zelphia Cairo, MD  IDENTIFYING STATEMENT:  The patient is a 31 year old woman seen at request of Dr. Link Snuffer with pulmonary embolism and DVT.  HISTORY OF PRESENT ILLNESS:  The patient presented to the emergency room with left lower extremity swelling of 2 weeks' duration.  She also noted infrequent chest discomfort associated with mild shortness of breath. The patient on admission received a CT angiogram on 06/09/2012 that showed segmental/subsegmental pulmonary emboli throughout both lobes with moderate clot burden.  Dopplers performed on the same day showed deep vein thrombosis involving the left popliteal vein, left peroneal veins and superficial vein thrombosis involving branch of the left peroneal veins.  The patient was on oral contraceptive pills for 2 years which she has since discontinued.  The patient reports that her grandfather had blood clots, he is on lifelong Coumadin.  She is unsure if he has a hereditary blood disorder.  Her mother has ovarian cancer and is BRCA negative.  She had a hypercoagulable profile performed in the hospital which results note the following:  On 06/10/2012 lupus anticoagulant was not detected; beta 2 glycoprotein IgG, IgA and IgM were 0, 4 and 6 respectively; cardiolipin IgA, IgG and IgM were 3, 9 and 5 respectively; factor V Leiden mutation was not noted; antithrombin was adequate at 77; protein S activity unremarkable at 85 and protein C activity unremarkable at 124%.  She was managed with initially with Lovenox which has subsequently bridged to Coumadin.  She reports that INR obtained at the doctor's office this morning was therapeutic.  She is asymptomatic except for some weight loss.  She does exercise quite frequently.  She denies rectal bleeding.  MEDICATIONS: 1. Valtrex 500 mg daily. 2. Coumadin dose per her PCP's office.  ALLERGIES:  Aspirin.  PAST MEDICAL HISTORY:  Herpes.  SOCIAL  HISTORY:  She is a smoker, smokes 2-4 cigarettes a day and has done so for 13 years.  Drinks mild amount of alcohol.  She is engaged to be married.  She is a Radio producer.  FAMILY HISTORY:  As noted above the patient's grandfather had blood clots and is on lifelong anticoagulation.  The patient's mother has ovarian cancer.  REVIEW OF SYSTEMS:  Denies fever, chills, night sweats, anorexia.  Has had some weight loss.  GI:  Denies nausea, vomiting, abdominal pain, diarrhea, melena, hematochezia.  Cardiovascular:  Denies chest pain, PND, orthopnea, ankle swelling.  Respiration:  Denies cough, hemoptysis, wheeze, shortness of breath.  Skin:  No bruising or bleeding. Neurologic:  Denies headache, vision change, extremity weakness.  Rest of review of systems negative.  PHYSICAL EXAM:  General:  The patient is a well-appearing, well- nourished woman in no current distress.  Vitals:  Pulse 57, blood pressure 105/68, temperature 97.3, respirations 20, weight 167 pounds. HEENT:  Head is atraumatic, normocephalic.  Sclerae anicteric.  Mouth moist.  Neck:  Supple.  Chest:  Clear to percussion and auscultation. CVS:  First and second heart sounds present.  No added sounds or murmurs.  Abdomen:  Soft, nontender.  No masses.  No hepatomegaly. Bowel sounds present.  Extremities:  Reveal no edema.  Pulses:  Present and symmetrical.  IMPRESSION AND PLAN:  Ms. Desiree Reed is a 31 year old woman recently diagnosed with bilateral pulmonary emboli and left lower extremity deep venous thrombosis is currently anticoagulated with Coumadin and appears to be doing well with no evidence of bleeding.  With this said the patient was on the oral contraceptive pill  prior to this incident which she has since discontinued.  Her grandfather has blood clots but it is unknown if he carries a hereditary hypercoagulable protein.  The patient's hypercarbic profile which was somewhat limited has been negative so far.   A prothrombin gene mutation was not obtained so before the patient is discharged will have labs drawn.  The patient was essentially told that would prefer that she remain on anticoagulation for at least a minimum of 1 year.  At the end of that year I will have her repeat lower extremity Dopplers, if unremarkable will have her discontinue anticoagulation and have her recheck her hypercoagulable profile 2 weeks after she discontinues.  I also recommend that she obtain a CT scan of the abdomen and pelvis to rule out any underlying occult malignancy.  I also recommend that in 2-3 months' time she obtain a CT angiogram to follow up pulmonary emboli.  She was reminded about the side effects of Coumadin which is primarily bleeding and to refrain from excessive exercise.  We also discussed the recent FDA prescribed anticoagulant, Xarelto which is an alternative to Coumadin and prevents frequent INR checks.  The patient will discuss this further with Dr. Link Snuffer.  She and her fiance had a number of questions, which were all answered to their satisfaction.  I will be telephoning her with results of upcoming prothrombin gene mutation, blood work and CT of the abdomen and pelvis.  She follows up with me in 6 months' time for ongoing assessment.    ______________________________ Laurice Record, M.D. LIO/MEDQ  D:  06/16/2012  T:  06/16/2012  Job:  [id number]

## 2012-06-21 ENCOUNTER — Telehealth: Payer: Self-pay | Admitting: Nurse Practitioner

## 2012-06-21 ENCOUNTER — Ambulatory Visit (HOSPITAL_COMMUNITY)
Admission: RE | Admit: 2012-06-21 | Discharge: 2012-06-21 | Disposition: A | Discharge status: Home / Self Care | Payer: 59 | Source: Ambulatory Visit | Attending: Hematology and Oncology | Admitting: Hematology and Oncology

## 2012-06-21 DIAGNOSIS — N281 Cyst of kidney, acquired: Secondary | ICD-10-CM | POA: Insufficient documentation

## 2012-06-21 DIAGNOSIS — R634 Abnormal weight loss: Secondary | ICD-10-CM | POA: Insufficient documentation

## 2012-06-21 DIAGNOSIS — I82409 Acute embolism and thrombosis of unspecified deep veins of unspecified lower extremity: Secondary | ICD-10-CM

## 2012-06-21 DIAGNOSIS — I2699 Other pulmonary embolism without acute cor pulmonale: Secondary | ICD-10-CM

## 2012-06-21 MED ORDER — IOHEXOL 300 MG/ML  SOLN
100.0000 mL | Freq: Once | INTRAMUSCULAR | Status: AC | PRN
  Administered 2012-06-21: 100 mL via INTRAVENOUS

## 2012-06-21 NOTE — Telephone Encounter (Signed)
Spoke with patient- informed per Dr. Dalene Carrow, CT scans were fine.  Pt verbalized understanding.

## 2012-06-21 NOTE — Telephone Encounter (Signed)
Message copied by Barbara Cower on Wed Jun 21, 2012  4:54 PM ------      Message from: Arlan Organ I      Created: Wed Jun 21, 2012  2:32 PM       Call pt - let her know that her Ct scans were fine.            LO

## 2012-06-22 ENCOUNTER — Telehealth: Payer: Self-pay | Admitting: Nurse Practitioner

## 2012-06-22 NOTE — Telephone Encounter (Signed)
Message copied by Barbara Cower on Thu Jun 22, 2012  9:38 AM ------      Message from: Arlan Organ I      Created: Thu Jun 22, 2012  8:45 AM       Call pt - results negatuive

## 2012-08-15 ENCOUNTER — Telehealth: Payer: Self-pay | Admitting: *Deleted

## 2012-08-15 ENCOUNTER — Ambulatory Visit (HOSPITAL_COMMUNITY)
Admission: RE | Admit: 2012-08-15 | Discharge: 2012-08-15 | Disposition: A | Discharge status: Home / Self Care | Payer: 59 | Source: Ambulatory Visit | Attending: Hematology and Oncology | Admitting: Hematology and Oncology

## 2012-08-15 DIAGNOSIS — I2699 Other pulmonary embolism without acute cor pulmonale: Secondary | ICD-10-CM | POA: Insufficient documentation

## 2012-08-15 DIAGNOSIS — I82409 Acute embolism and thrombosis of unspecified deep veins of unspecified lower extremity: Secondary | ICD-10-CM | POA: Insufficient documentation

## 2012-08-15 MED ORDER — IOHEXOL 350 MG/ML SOLN
100.0000 mL | Freq: Once | INTRAVENOUS | Status: AC | PRN
  Administered 2012-08-15: 100 mL via INTRAVENOUS

## 2012-08-15 NOTE — Telephone Encounter (Signed)
Spoke with pt and informed pt re: CT angio showed resolved pulmonary embolism as per md's instructions.

## 2012-10-14 ENCOUNTER — Telehealth: Payer: Self-pay | Admitting: Internal Medicine

## 2012-10-14 ENCOUNTER — Encounter: Payer: Self-pay | Admitting: Internal Medicine

## 2012-10-14 NOTE — Telephone Encounter (Signed)
LVOM for pt to return call.  °

## 2012-10-28 ENCOUNTER — Other Ambulatory Visit: Payer: Self-pay

## 2013-01-04 ENCOUNTER — Ambulatory Visit (HOSPITAL_BASED_OUTPATIENT_CLINIC_OR_DEPARTMENT_OTHER): Payer: 59 | Admitting: Physician Assistant

## 2013-01-04 ENCOUNTER — Ambulatory Visit: Payer: 59 | Admitting: Hematology and Oncology

## 2013-01-04 VITALS — BP 115/69 | HR 64 | Temp 98.1°F | Resp 20 | Ht 68.0 in | Wt 170.6 lb

## 2013-01-04 DIAGNOSIS — I2699 Other pulmonary embolism without acute cor pulmonale: Secondary | ICD-10-CM

## 2013-01-04 DIAGNOSIS — I82402 Acute embolism and thrombosis of unspecified deep veins of left lower extremity: Secondary | ICD-10-CM

## 2013-01-04 DIAGNOSIS — I82409 Acute embolism and thrombosis of unspecified deep veins of unspecified lower extremity: Secondary | ICD-10-CM

## 2013-01-04 NOTE — Patient Instructions (Addendum)
Continue your anticoagulation therapy for a total of 1 year Followup with Dr. Arbutus Ped in 6 months with repeat lower extremity Dopplers and hypercoagulable profile 2 weeks prior to that visit.

## 2013-01-08 ENCOUNTER — Telehealth: Payer: Self-pay | Admitting: Internal Medicine

## 2013-01-08 NOTE — Progress Notes (Signed)
No images are attached to the encounter. No scans are attached to the encounter. No scans are attached to the encounter. [facility] OFFICE PROGRESS NOTE  Desiree Penna, MD [address] [place] [address]  DIAGNOSIS: Bilateral pulmonary emboli and left lower extremity DVT  PRIOR THERAPY: None  CURRENT THERAPY: Xarelto 20 mg by mouth daily  INTERVAL HISTORY: Desiree Reed 32 y.o. female returns for a scheduled regular office visit for followup of her history of bilateral pulmonary emboli and left lower extremity deep vein thromboses. The patient was previously followed by Dr. Dalene Carrow, who has left the practice. She was seen 6 months ago and in the interim had a followup CT angiography of the chest in December of 2013 which revealed resolution of the bilateral pulmonary emboli. She was initially treated with Coumadin however was changed to Xarelto by her primary care physician shortly after being seen by Dr. Dalene Carrow in October of 2013. Patient reports that she was also started on a new oral contraceptive pills starting in October 2013 that does not contain progesterone. She still has some occasional left lower extremity swelling and very occasional pain but voices no other specific complaints. She does report that she quit smoking in September 2013 when she was diagnosed with a pulmonary emboli. She denied any chest pain, shortness of breath although she does report some occasional "lung pain". She states that this is intermittent in nature and has not occurred recently. Her medications were reviewed and updated. Her family history was also updated, her grandfather has passed away, he previously was on lifelong Coumadin for blood clots. Her mother with ovarian cancer (BRCA negative) is still in treatment. The patient is getting married in 02/10/2013. She has her annual GYN exam next week and will discuss with her gynecologist her heavy periods.  MEDICAL HISTORY: Past Medical  History  Diagnosis Date  . Herpes     ALLERGIES:  is allergic to aspirin.  MEDICATIONS:  Current Outpatient Prescriptions  Medication Sig Dispense Refill  . norethindrone (MICRONOR,CAMILA,ERRIN) 0.35 MG tablet Take 1 tablet by mouth daily.      . Rivaroxaban (XARELTO) 20 MG TABS Take 20 mg by mouth daily.      . valACYclovir (VALTREX) 500 MG tablet Take 500 mg by mouth daily.         No current facility-administered medications for this visit.    SURGICAL HISTORY: No past surgical history on file.  REVIEW OF SYSTEMS:  Pertinent items are noted in HPI.   PHYSICAL EXAMINATION: General appearance: alert, cooperative, appears stated age and no distress Head: Normocephalic, without obvious abnormality, atraumatic Neck: no adenopathy, no carotid bruit, no JVD, supple, symmetrical, trachea midline and thyroid not enlarged, symmetric, no tenderness/mass/nodules Lymph nodes: Cervical, supraclavicular, and axillary nodes normal. Resp: clear to auscultation bilaterally Cardio: regular rate and rhythm, S1, S2 normal, no murmur, click, rub or gallop GI: soft, non-tender; bowel sounds normal; no masses,  no organomegaly Extremities: edema 1+ soft tissue edema left lower extremity, right lower extremity without edema Neurologic: Alert and oriented X 3, normal strength and tone. Normal symmetric reflexes. Normal coordination and gait  ECOG PERFORMANCE STATUS: 0 - Asymptomatic  Blood pressure 115/69, pulse 64, temperature 98.1 F (36.7 C), temperature source Oral, resp. rate 20, height 5\' 8"  (1.727 m), weight 170 lb 9.6 oz (77.384 kg).  LABORATORY DATA: Lab Results  Component Value Date   WBC 6.7 06/12/2012   HGB 13.0 06/12/2012   HCT 38.8 06/12/2012  MCV 93.0 06/12/2012   PLT 207 06/12/2012      Chemistry      Component Value Date/Time   NA 139 06/12/2012 0615   K 4.0 06/12/2012 0615   CL 104 06/12/2012 0615   CO2 27 06/12/2012 0615   BUN 12 06/12/2012 0615   CREATININE 0.69 06/12/2012  0615      Component Value Date/Time   CALCIUM 9.5 06/12/2012 0615   ALKPHOS 63 06/10/2012 0535   AST 30 06/10/2012 0535   ALT 47* 06/10/2012 0535   BILITOT 0.6 06/10/2012 0535       RADIOGRAPHIC STUDIES:  No results found.   ASSESSMENT/PLAN: The patient is a very pleasant 32 year old white female with history of bilateral pulmonary emboli and left lower extremity DVT. She is currently on anticoagulation with the Molly Maduro oh. It was recommended that she continue anticoagulation therapy for one year. The one year of therapy will be late September of this year. The patient was discussed with Dr. Arbutus Ped. She's advised to continue on her Xarelto at the current dose and followup with her primary care physician as scheduled. She will followup with Dr. Arbutus Ped in 6 months with repeat bilateral lower extremity Dopplers and hypercoagulable profile. These studies will be done 2 weeks prior to the physician visits so the results will be available for him to discuss with the patient. She is encouraged to keep her appointment for her annual GYN exam.   Desiree Reed, Desiree Delancy E, PA-C     All questions were answered. The patient knows to call the clinic with any problems, questions or concerns. We can certainly see the patient much sooner if necessary.  I spent 25 minutes counseling the patient face to face. The total time spent in the appointment was 35 minutes.

## 2013-06-22 ENCOUNTER — Telehealth: Payer: Self-pay | Admitting: Internal Medicine

## 2013-06-22 NOTE — Telephone Encounter (Signed)
pt confirmed appts and what they were for shh

## 2013-06-25 ENCOUNTER — Other Ambulatory Visit: Payer: Self-pay | Admitting: *Deleted

## 2013-06-25 ENCOUNTER — Ambulatory Visit (HOSPITAL_COMMUNITY)
Admission: RE | Admit: 2013-06-25 | Discharge: 2013-06-25 | Disposition: A | Discharge status: Home / Self Care | Payer: 59 | Source: Ambulatory Visit | Attending: Physician Assistant | Admitting: Physician Assistant

## 2013-06-25 ENCOUNTER — Encounter (HOSPITAL_COMMUNITY): Payer: 59

## 2013-06-25 ENCOUNTER — Telehealth: Payer: Self-pay | Admitting: Internal Medicine

## 2013-06-25 DIAGNOSIS — Z86718 Personal history of other venous thrombosis and embolism: Secondary | ICD-10-CM | POA: Insufficient documentation

## 2013-06-25 DIAGNOSIS — Z09 Encounter for follow-up examination after completed treatment for conditions other than malignant neoplasm: Secondary | ICD-10-CM | POA: Insufficient documentation

## 2013-06-25 DIAGNOSIS — I82402 Acute embolism and thrombosis of unspecified deep veins of left lower extremity: Secondary | ICD-10-CM

## 2013-06-25 DIAGNOSIS — Z9889 Other specified postprocedural states: Secondary | ICD-10-CM

## 2013-06-25 NOTE — Progress Notes (Signed)
VASCULAR LAB PRELIMINARY  PRELIMINARY  PRELIMINARY  PRELIMINARY  Bilateral lower extremity venous duplex  completed.    Preliminary report:  Bilateral:  No evidence of DVT, superficial thrombosis, or Baker's Cyst.  Previous left popliteal and peroneal vein thrombus appears resolved.    Garyson Stelly, RVT 06/25/2013, 10:28 AM

## 2013-06-25 NOTE — Telephone Encounter (Signed)
lvm for pt regarding to lab being moved to 10.16.14 from est day.

## 2013-06-28 ENCOUNTER — Other Ambulatory Visit (HOSPITAL_BASED_OUTPATIENT_CLINIC_OR_DEPARTMENT_OTHER): Payer: 59 | Admitting: Lab

## 2013-06-28 ENCOUNTER — Other Ambulatory Visit: Payer: 59 | Admitting: Lab

## 2013-06-28 DIAGNOSIS — I82409 Acute embolism and thrombosis of unspecified deep veins of unspecified lower extremity: Secondary | ICD-10-CM

## 2013-06-28 DIAGNOSIS — I2699 Other pulmonary embolism without acute cor pulmonale: Secondary | ICD-10-CM

## 2013-06-28 DIAGNOSIS — I82402 Acute embolism and thrombosis of unspecified deep veins of left lower extremity: Secondary | ICD-10-CM

## 2013-06-29 LAB — HYPERCOAGULABLE PANEL, COMPREHENSIVE
AntiThromb III Func: 101 % (ref 76–126)
Anticardiolipin IgA: 1 APL U/mL (ref <22)
Beta-2-Glycoprotein I IgM: 7 M Units (ref <20)
DRVVT 1:1 Mix: 42.4 secs (ref <42.9)
Lupus Anticoagulant: NOT DETECTED
PTTLA 4:1 Mix: 46.7 secs — ABNORMAL HIGH (ref 28.0–43.0)
Protein S Activity: 115 % (ref 69–129)

## 2013-07-09 ENCOUNTER — Ambulatory Visit (HOSPITAL_BASED_OUTPATIENT_CLINIC_OR_DEPARTMENT_OTHER): Payer: 59 | Admitting: Internal Medicine

## 2013-07-09 ENCOUNTER — Encounter: Payer: Self-pay | Admitting: Internal Medicine

## 2013-07-09 ENCOUNTER — Other Ambulatory Visit: Payer: 59 | Admitting: Lab

## 2013-07-09 VITALS — BP 117/70 | HR 61 | Temp 98.4°F | Resp 18 | Ht 68.0 in | Wt 185.1 lb

## 2013-07-09 DIAGNOSIS — I82402 Acute embolism and thrombosis of unspecified deep veins of left lower extremity: Secondary | ICD-10-CM

## 2013-07-09 DIAGNOSIS — I82409 Acute embolism and thrombosis of unspecified deep veins of unspecified lower extremity: Secondary | ICD-10-CM

## 2013-07-09 DIAGNOSIS — I2699 Other pulmonary embolism without acute cor pulmonale: Secondary | ICD-10-CM

## 2013-07-09 DIAGNOSIS — Z7901 Long term (current) use of anticoagulants: Secondary | ICD-10-CM

## 2013-07-09 NOTE — Progress Notes (Signed)
[facility] Telephone:([phone]) [phone]   Fax:([phone]  OFFICE PROGRESS NOTE  [name], MD [address] [place] [address]  DIAGNOSIS: Bilateral pulmonary emboli and left lower extremity DVT   PRIOR THERAPY: None   CURRENT THERAPY: Xarelto 20 mg by mouth daily  INTERVAL HISTORY: Desiree Reed 32 y.o. female returns to the clinic today for followup visit accompanied by her husband. The patient is feeling fine today with no specific complaints. She denied having any significant chest pain, shortness of breath, cough or hemoptysis. She continues to have some occasional aching pain in her left lower extremity. The patient denied having any bleeding, bruises or ecchymosis. She still on treatment with Xarelto for her history of bilateral pulmonary emboli. She had a recent lower extremity duplex as well as hypercoagulable panel and she is here for evaluation and discussion of her lab and imaging results.  MEDICAL HISTORY: Past Medical History  Diagnosis Date  . Herpes     ALLERGIES:  is allergic to aspirin.  MEDICATIONS:  Current Outpatient Prescriptions  Medication Sig Dispense Refill  . norethindrone (MICRONOR,CAMILA,ERRIN) 0.35 MG tablet Take 1 tablet by mouth daily.      . Rivaroxaban (XARELTO) 20 MG TABS Take 20 mg by mouth daily.      . valACYclovir (VALTREX) 500 MG tablet Take 500 mg by mouth daily.         No current facility-administered medications for this visit.    REVIEW OF SYSTEMS:  A comprehensive review of systems was negative.   PHYSICAL EXAMINATION: General appearance: alert, cooperative and no distress Head: Normocephalic, without obvious abnormality, atraumatic Neck: no adenopathy, no JVD, supple, symmetrical, trachea midline and thyroid not enlarged, symmetric, no tenderness/mass/nodules Lymph nodes: Cervical, supraclavicular, and axillary nodes normal. Resp: clear to auscultation bilaterally Back: symmetric, no curvature.  ROM normal. No CVA tenderness. Cardio: regular rate and rhythm, S1, S2 normal, no murmur, click, rub or gallop GI: soft, non-tender; bowel sounds normal; no masses,  no organomegaly Extremities: extremities normal, atraumatic, no cyanosis or edema  ECOG PERFORMANCE STATUS: 0 - Asymptomatic  Blood pressure 117/70, pulse 61, temperature 98.4 F (36.9 C), temperature source Oral, resp. rate 18, height 5\' 8"  (1.727 m), weight 185 lb 1.6 oz (83.961 kg).  LABORATORY DATA: Lab Results  Component Value Date   WBC 6.7 06/12/2012   HGB 13.0 06/12/2012   HCT 38.8 06/12/2012   MCV 93.0 06/12/2012   PLT 207 06/12/2012      Chemistry      Component Value Date/Time   NA 139 06/12/2012 0615   K 4.0 06/12/2012 0615   CL 104 06/12/2012 0615   CO2 27 06/12/2012 0615   BUN 12 06/12/2012 0615   CREATININE 0.69 06/12/2012 0615      Component Value Date/Time   CALCIUM 9.5 06/12/2012 0615   ALKPHOS 63 06/10/2012 0535   AST 30 06/10/2012 0535   ALT 47* 06/10/2012 0535   BILITOT 0.6 06/10/2012 0535     Hypercoagulable panel showed no significant concerning abnormality, with no detectable lupus anticoagulant, negative factor V Leiden mutation and negative prothrombin II gene mutation.   RADIOGRAPHIC STUDIES: No results found.  ASSESSMENT AND PLAN: This is a very pleasant 32 years old white female with history of bilateral pulmonary emboli and left lower extremity deep venous thrombosis status post 1 year treatment with Xarelto. The patient has no evidence for residual or new deep venous thrombosis.  The hypercoagulable panel showed no  concerning abnormalities. I recommended for the patient to discontinue her current treatment was arrived at this point. I advised her to take aspirin 81 mg by mouth daily. The patient was given instruction about frequent mobilization of her lower extremities especially during travel. She will followup with her primary care physician and I would be happy to see him in the future on  as-needed basis.  The patient voices understanding of current disease status and treatment options and is in agreement with the current care plan.  All questions were answered. The patient knows to call the clinic with any problems, questions or concerns. We can certainly see the patient much sooner if necessary.

## 2013-07-09 NOTE — Patient Instructions (Signed)
Followup visit on as-needed basis 

## 2013-07-19 ENCOUNTER — Other Ambulatory Visit: Payer: Self-pay

## 2015-01-15 ENCOUNTER — Ambulatory Visit: Payer: Self-pay | Admitting: Sports Medicine

## 2016-02-01 ENCOUNTER — Emergency Department (HOSPITAL_COMMUNITY): Payer: 59

## 2016-02-01 ENCOUNTER — Emergency Department (HOSPITAL_COMMUNITY)
Admission: EM | Admit: 2016-02-01 | Discharge: 2016-02-01 | Disposition: A | Discharge status: Home / Self Care | Payer: 59 | Attending: Emergency Medicine | Admitting: Emergency Medicine

## 2016-02-01 ENCOUNTER — Encounter (HOSPITAL_COMMUNITY): Payer: Self-pay | Admitting: Nurse Practitioner

## 2016-02-01 DIAGNOSIS — N83209 Unspecified ovarian cyst, unspecified side: Secondary | ICD-10-CM

## 2016-02-01 DIAGNOSIS — Z79899 Other long term (current) drug therapy: Secondary | ICD-10-CM | POA: Insufficient documentation

## 2016-02-01 DIAGNOSIS — Z792 Long term (current) use of antibiotics: Secondary | ICD-10-CM | POA: Insufficient documentation

## 2016-02-01 DIAGNOSIS — N83202 Unspecified ovarian cyst, left side: Secondary | ICD-10-CM | POA: Insufficient documentation

## 2016-02-01 DIAGNOSIS — Z791 Long term (current) use of non-steroidal anti-inflammatories (NSAID): Secondary | ICD-10-CM | POA: Diagnosis not present

## 2016-02-01 DIAGNOSIS — K529 Noninfective gastroenteritis and colitis, unspecified: Secondary | ICD-10-CM | POA: Diagnosis not present

## 2016-02-01 DIAGNOSIS — R1032 Left lower quadrant pain: Secondary | ICD-10-CM | POA: Diagnosis present

## 2016-02-01 DIAGNOSIS — R112 Nausea with vomiting, unspecified: Secondary | ICD-10-CM

## 2016-02-01 HISTORY — DX: Other pulmonary embolism without acute cor pulmonale: I26.99

## 2016-02-01 LAB — URINALYSIS, ROUTINE W REFLEX MICROSCOPIC
BILIRUBIN URINE: NEGATIVE
Glucose, UA: NEGATIVE mg/dL
Ketones, ur: 15 mg/dL — AB
Leukocytes, UA: NEGATIVE
Nitrite: NEGATIVE
PROTEIN: NEGATIVE mg/dL
Specific Gravity, Urine: 1.022 (ref 1.005–1.030)
pH: 5.5 (ref 5.0–8.0)

## 2016-02-01 LAB — COMPREHENSIVE METABOLIC PANEL
ALK PHOS: 50 U/L (ref 38–126)
ALT: 32 U/L (ref 14–54)
AST: 31 U/L (ref 15–41)
Albumin: 4.6 g/dL (ref 3.5–5.0)
Anion gap: 11 (ref 5–15)
BUN: 20 mg/dL (ref 6–20)
CALCIUM: 9 mg/dL (ref 8.9–10.3)
CO2: 21 mmol/L — AB (ref 22–32)
Chloride: 109 mmol/L (ref 101–111)
Creatinine, Ser: 0.48 mg/dL (ref 0.44–1.00)
GFR calc non Af Amer: >60 mL/min (ref >60)
GLUCOSE: 125 mg/dL — AB (ref 65–99)
Potassium: 3.5 mmol/L (ref 3.5–5.1)
SODIUM: 141 mmol/L (ref 135–145)
Total Bilirubin: 0.9 mg/dL (ref 0.3–1.2)
Total Protein: 6.9 g/dL (ref 6.5–8.1)

## 2016-02-01 LAB — CBC
HCT: 42.4 % (ref 36.0–46.0)
Hemoglobin: 14.8 g/dL (ref 12.0–15.0)
MCH: 31.6 pg (ref 26.0–34.0)
MCHC: 34.9 g/dL (ref 30.0–36.0)
MCV: 90.4 fL (ref 78.0–100.0)
Platelets: 234 10*3/uL (ref 150–400)
RBC: 4.69 MIL/uL (ref 3.87–5.11)
RDW: 12.4 % (ref 11.5–15.5)
WBC: 17.5 10*3/uL — ABNORMAL HIGH (ref 4.0–10.5)

## 2016-02-01 LAB — URINE MICROSCOPIC-ADD ON

## 2016-02-01 LAB — POC URINE PREG, ED: PREG TEST UR: NEGATIVE

## 2016-02-01 LAB — LIPASE, BLOOD: Lipase: 20 U/L (ref 11–51)

## 2016-02-01 MED ORDER — PANTOPRAZOLE SODIUM 40 MG IV SOLR
40.0000 mg | Freq: Once | INTRAVENOUS | Status: AC
  Administered 2016-02-01: 40 mg via INTRAVENOUS
  Filled 2016-02-01: qty 40

## 2016-02-01 MED ORDER — MORPHINE SULFATE (PF) 4 MG/ML IV SOLN
4.0000 mg | Freq: Once | INTRAVENOUS | Status: DC
  Filled 2016-02-01: qty 1

## 2016-02-01 MED ORDER — IOPAMIDOL (ISOVUE-300) INJECTION 61%
100.0000 mL | Freq: Once | INTRAVENOUS | Status: AC | PRN
  Administered 2016-02-01: 100 mL via INTRAVENOUS

## 2016-02-01 MED ORDER — CIPROFLOXACIN HCL 500 MG PO TABS: 500.0000 mg | ORAL_TABLET | Freq: Two times a day (BID) | ORAL | Status: DC

## 2016-02-01 MED ORDER — ONDANSETRON 4 MG PO TBDP
4.0000 mg | ORAL_TABLET | Freq: Once | ORAL | Status: AC
  Administered 2016-02-01: 4 mg via ORAL
  Filled 2016-02-01: qty 1

## 2016-02-01 MED ORDER — METRONIDAZOLE 500 MG PO TABS: 500.0000 mg | ORAL_TABLET | Freq: Two times a day (BID) | ORAL | Status: DC

## 2016-02-01 MED ORDER — ONDANSETRON HCL 4 MG/2ML IJ SOLN
4.0000 mg | Freq: Once | INTRAMUSCULAR | Status: AC
  Administered 2016-02-01: 4 mg via INTRAVENOUS
  Filled 2016-02-01: qty 2

## 2016-02-01 MED ORDER — SODIUM CHLORIDE 0.9 % IV BOLUS (SEPSIS)
1000.0000 mL | Freq: Once | INTRAVENOUS | Status: AC
  Administered 2016-02-01: 1000 mL via INTRAVENOUS

## 2016-02-01 MED ORDER — ONDANSETRON 8 MG PO TBDP: ORAL_TABLET | ORAL | Status: DC

## 2016-02-01 NOTE — Discharge Instructions (Signed)
1. Medications: zofran, Cipro, Flagyl, usual home medications 2. Treatment: rest, drink plenty of fluids, advance diet slowly 3. Follow Up: Please followup with your primary doctor in 2 days for discussion of your diagnoses and further evaluation after today's visit; if you do not have a primary care doctor use the resource guide provided to find one; Please return to the ER for persistent vomiting, high fevers or worsening symptoms

## 2016-02-01 NOTE — ED Notes (Signed)
Pt presents actively vomiting, states it was a sudden onset, now having severe "heat in the stomach that radiates to the back." Denies any significant GI hx.

## 2016-02-01 NOTE — ED Provider Notes (Signed)
CSN: [id number]     Arrival date & time 02/01/16  0013 History   First MD Initiated Contact with Patient 02/01/16 0231     Chief Complaint  Patient presents with  . Emesis  . Abdominal Pain     (Consider location/radiation/quality/duration/timing/severity/associated sxs/prior Treatment) The history is provided by the patient, a significant other and medical records. No language interpreter was used.    Desiree Reed is a 35 y.o. female  with a hx of PE (2013) presents to the Emergency Department complaining of gradual, persistent, progressively worsening LLQ and epigastric abd pain described as burning and rated at a 7/10 onset around 10pm after dinner and 3 glasses of wine.  Pt reports intermittent hx of reflux symptoms, but no medications for GERD.  Mrs. was nonbloody and nonbilious. Patient denies diarrhea. LMP: last week.  Pt is not on birth control as they are attempting to get pregnant, but reports she ovulated in the last 2 days according to her tracker.  Her last emesis was approx 49min ago.  Pt denies diarrhea, hematemesis, melena or hematochezia.  She also fever, chills, headache, neck pain, chest pain, SOB, dysuria, hematuria.  Pt denies recent travel or sick contacts.     Past Medical History  Diagnosis Date  . PE (pulmonary embolism) 2013   History reviewed. No pertinent past surgical history. History reviewed. No pertinent family history. Social History  Substance Use Topics  . Smoking status: Never Smoker   . Smokeless tobacco: None  . Alcohol Use: Yes   OB History    No data available     Review of Systems  Constitutional: Negative for fever, diaphoresis, appetite change, fatigue and unexpected weight change.  HENT: Negative for mouth sores.   Eyes: Negative for visual disturbance.  Respiratory: Negative for cough, chest tightness, shortness of breath and wheezing.   Cardiovascular: Negative for chest pain.  Gastrointestinal: Positive for nausea, vomiting and  abdominal pain. Negative for diarrhea and constipation.  Endocrine: Negative for polydipsia, polyphagia and polyuria.  Genitourinary: Negative for dysuria, urgency, frequency and hematuria.  Musculoskeletal: Negative for back pain and neck stiffness.  Skin: Negative for rash.  Allergic/Immunologic: Negative for immunocompromised state.  Neurological: Negative for syncope, light-headedness and headaches.  Hematological: Does not bruise/bleed easily.  Psychiatric/Behavioral: Negative for sleep disturbance. The patient is not nervous/anxious.       Allergies  Review of patient's allergies indicates no known allergies.  Home Medications   Prior to Admission medications   Medication Sig Start Date End Date Taking? Authorizing Provider  acyclovir (ZOVIRAX) 400 MG tablet Take 400 mg by mouth 2 (two) times daily.   Yes Historical Provider, MD  Multiple Vitamin (MULTIVITAMIN WITH MINERALS) TABS tablet Take 1 tablet by mouth daily.   Yes Historical Provider, MD  naproxen sodium (ANAPROX) 220 MG tablet Take 220-440 mg by mouth 2 (two) times daily as needed.   Yes Historical Provider, MD  ciprofloxacin (CIPRO) 500 MG tablet Take 1 tablet (500 mg total) by mouth 2 (two) times daily. 02/01/16   Rolly Magri, PA-C  metroNIDAZOLE (FLAGYL) 500 MG tablet Take 1 tablet (500 mg total) by mouth 2 (two) times daily. 02/01/16   Jarek Longton, PA-C  ondansetron (ZOFRAN ODT) 8 MG disintegrating tablet 8mg  ODT q4 hours prn nausea 02/01/16   Sherrilyn Nairn, PA-C   BP 103/70 mmHg  Pulse 83  Temp(Src) 99.8 F (37.7 C) (Rectal)  Resp 18  SpO2 97%  LMP 01/18/2016 Physical Exam  Constitutional: She appears well-developed  and well-nourished. No distress.  Awake, alert, nontoxic appearance  HENT:  Head: Normocephalic and atraumatic.  Mouth/Throat: Oropharynx is clear and moist. No oropharyngeal exudate.  Eyes: Conjunctivae are normal. No scleral icterus.  Neck: Normal range of motion. Neck  supple.  Cardiovascular: Normal rate, regular rhythm, normal heart sounds and intact distal pulses.   No murmur heard. Pulmonary/Chest: Effort normal and breath sounds normal. No respiratory distress. She has no wheezes.  Equal chest expansion  Abdominal: Soft. Bowel sounds are normal. She exhibits no distension and no mass. There is generalized tenderness. There is no rebound, no guarding and no CVA tenderness.  Generalized abdominal tenderness without rebound or guarding  Musculoskeletal: Normal range of motion. She exhibits no edema.  Neurological: She is alert.  Speech is clear and goal oriented Moves extremities without ataxia  Skin: Skin is warm and dry. She is not diaphoretic.  Psychiatric: She has a normal mood and affect.  Nursing note and vitals reviewed.   ED Course  Procedures (including critical care time) Labs Review Labs Reviewed  COMPREHENSIVE METABOLIC PANEL - Abnormal; Notable for the following:    CO2 21 (*)    Glucose, Bld 125 (*)    All other components within normal limits  CBC - Abnormal; Notable for the following:    WBC 17.5 (*)    All other components within normal limits  URINALYSIS, ROUTINE W REFLEX MICROSCOPIC (NOT AT [facility]) - Abnormal; Notable for the following:    Hgb urine dipstick TRACE (*)    Ketones, ur 15 (*)    All other components within normal limits  URINE MICROSCOPIC-ADD ON - Abnormal; Notable for the following:    Squamous Epithelial / LPF 0-5 (*)    Bacteria, UA RARE (*)    All other components within normal limits  LIPASE, BLOOD  POC URINE PREG, ED    Imaging Review Ct Abdomen Pelvis W Contrast  02/01/2016  CLINICAL DATA:  Sudden onset nausea and vomiting, abdominal pain, and leukocytosis. EXAM: CT ABDOMEN AND PELVIS WITH CONTRAST TECHNIQUE: Multidetector CT imaging of the abdomen and pelvis was performed using the standard protocol following bolus administration of intravenous contrast. CONTRAST:  195mL ISOVUE-300 IOPAMIDOL  (ISOVUE-300) INJECTION 61% COMPARISON:  None. FINDINGS: Dependent changes in the lung bases. The liver, spleen, gallbladder, pancreas, adrenal glands, kidneys, abdominal aorta, inferior vena cava, and retroperitoneal lymph nodes are unremarkable. Stomach, small bowel, and colon are not abnormally distended. Suggestion of mild small bowel wall thickening diffusely. This could be due to under distention but appearance may indicate enteritis. Scattered stool in the colon. No free air or free fluid in the abdomen. Pelvis: The appendix is normal. Bladder wall is not thickened. Uterus is not enlarged. Hemorrhagic cyst in the left ovary with fluid fluid level, measuring 4.2 cm. Based on size and appearance in a premenopausal patient, no follow-up is indicated. No free or loculated pelvic fluid collections. No pelvic lymphadenopathy. No destructive bone lesions. IMPRESSION: Suggestion of diffuse small bowel wall thickening which may indicate enteritis. No evidence of bowel obstruction. Hemorrhagic cyst in the left ovary. Electronically Signed   By: Lucienne Capers M.D.   On: 02/01/2016 04:25   I have personally reviewed and evaluated these images and lab results as part of my medical decision-making.    MDM   Final diagnoses:  Enteritis  Hemorrhagic ovarian cyst  Non-intractable vomiting with nausea, vomiting of unspecified type    Rene Paci presents with LLQ abd pain, nausea and vomiting since 10pm.  Abdomen is generally tender without rebound or guarding. Patient with significant leukocytosis. Will obtain CT scan.  Tachycardia noted on triage vitals however normal rate and rhythm on physical exam  6:24 AM CT scan with evidence of enteritis and left hemorrhagic ovarian cyst. This was likely the source of her left lower quadrant abdominal pain. Her vomiting is from the enteritis. Patient with elevated white count and increased temperature of 99.8. We'll treat with Cipro and Flagyl as this is  potentially bacterial.  Patient given Protonix, morphine and Zofran. She is feeling much better and has tolerated greater than 6 ounces of fluids without emesis. On repeat exam her abdomen is soft and nontender. No evidence of appendicitis.  Jarrett Soho Donyell Ding, PA-C 02/01/16 [id number]  Ripley Fraise, MD 02/02/16 [phone]

## 2016-02-03 ENCOUNTER — Encounter: Payer: Self-pay | Admitting: Internal Medicine

## 2016-03-23 LAB — OB RESULTS CONSOLE HIV ANTIBODY (ROUTINE TESTING): HIV: NONREACTIVE

## 2016-03-23 LAB — OB RESULTS CONSOLE GC/CHLAMYDIA
CHLAMYDIA, DNA PROBE: NEGATIVE
GC PROBE AMP, GENITAL: NEGATIVE

## 2016-03-23 LAB — OB RESULTS CONSOLE RUBELLA ANTIBODY, IGM: Rubella: IMMUNE

## 2016-03-23 LAB — OB RESULTS CONSOLE HEPATITIS B SURFACE ANTIGEN: HEP B S AG: NEGATIVE

## 2016-03-23 LAB — OB RESULTS CONSOLE RPR: RPR: NONREACTIVE

## 2016-03-23 LAB — OB RESULTS CONSOLE ANTIBODY SCREEN: ANTIBODY SCREEN: NEGATIVE

## 2016-03-23 LAB — OB RESULTS CONSOLE ABO/RH: RH Type: POSITIVE

## 2016-10-19 LAB — OB RESULTS CONSOLE GBS: STREP GROUP B AG: NEGATIVE

## 2016-10-27 ENCOUNTER — Encounter (HOSPITAL_COMMUNITY): Payer: Self-pay | Admitting: *Deleted

## 2016-10-27 ENCOUNTER — Telehealth (HOSPITAL_COMMUNITY): Payer: Self-pay | Admitting: *Deleted

## 2016-10-27 NOTE — Telephone Encounter (Signed)
Preadmission screen  

## 2016-11-08 NOTE — H&P (Signed)
Desiree Reed is a 36 y.o. female @ 39+2 presenting for IOL.  Pt with h/o DVT/PE - lovenox during pregnancy.  OB History    Gravida Para Term Preterm AB Living   1 0 0 0 0     SAB TAB Ectopic Multiple Live Births   0 0 0         Past Medical History:  Diagnosis Date  . AMA (advanced maternal age) primigravida 8+   . Asthma    childhood  . Herpes   . PE (pulmonary embolism) 2013   No past surgical history on file. Family History: family history includes Cancer in her mother; Diabetes in her father; Ovarian cancer in her mother. Social History:  reports that she has never smoked. She does not have any smokeless tobacco history on file. She reports that she drinks alcohol. She reports that she does not use drugs.     Maternal Diabetes: No Genetic Screening: Normal Maternal Ultrasounds/Referrals: Normal Fetal Ultrasounds or other Referrals:  None Maternal Substance Abuse:  No Significant Maternal Medications:  None Significant Maternal Lab Results:  None Other Comments:  None  ROS History   Last menstrual period 01/20/2016. Exam Physical Exam  Gen- NAD Abd - gravid, NT Ext - NT, no edema Cvx 2/60/-2 Prenatal labs: ABO, Rh: A/Positive/-- (07/11 0000) Antibody: Negative (07/11 0000) Rubella: Immune (07/11 0000) RPR: Nonreactive (07/11 0000)  HBsAg: Negative (07/11 0000)  HIV: Non-reactive (07/11 0000)  GBS:   negative  Assessment/Plan: Admit Cytotec/pitocin iol Epidural prn H/o PE/DVT - restart lovenox postpartum   Desiree Reed 11/08/2016, 3:17 PM

## 2016-11-09 ENCOUNTER — Inpatient Hospital Stay (HOSPITAL_COMMUNITY): Payer: 59 | Admitting: Anesthesiology

## 2016-11-09 ENCOUNTER — Encounter (HOSPITAL_COMMUNITY): Payer: Self-pay

## 2016-11-09 ENCOUNTER — Inpatient Hospital Stay (HOSPITAL_COMMUNITY)
Admission: RE | Admit: 2016-11-09 | Discharge: 2016-11-11 | DRG: 775 | Disposition: A | Discharge status: Home / Self Care | Payer: 59 | Source: Ambulatory Visit | Attending: Obstetrics and Gynecology | Admitting: Obstetrics and Gynecology

## 2016-11-09 VITALS — BP 112/60 | HR 62 | Temp 98.2°F | Resp 18 | Ht 68.0 in | Wt 211.0 lb

## 2016-11-09 DIAGNOSIS — Z86711 Personal history of pulmonary embolism: Secondary | ICD-10-CM | POA: Diagnosis not present

## 2016-11-09 DIAGNOSIS — Z3A39 39 weeks gestation of pregnancy: Secondary | ICD-10-CM

## 2016-11-09 DIAGNOSIS — Z833 Family history of diabetes mellitus: Secondary | ICD-10-CM

## 2016-11-09 DIAGNOSIS — Z3490 Encounter for supervision of normal pregnancy, unspecified, unspecified trimester: Secondary | ICD-10-CM

## 2016-11-09 DIAGNOSIS — Z3493 Encounter for supervision of normal pregnancy, unspecified, third trimester: Secondary | ICD-10-CM

## 2016-11-09 LAB — CBC
HEMATOCRIT: 35.8 % — AB (ref 36.0–46.0)
HEMOGLOBIN: 12.7 g/dL (ref 12.0–15.0)
MCH: 32.5 pg (ref 26.0–34.0)
MCHC: 35.5 g/dL (ref 30.0–36.0)
MCV: 91.6 fL (ref 78.0–100.0)
Platelets: 199 10*3/uL (ref 150–400)
RBC: 3.91 MIL/uL (ref 3.87–5.11)
RDW: 12.6 % (ref 11.5–15.5)
WBC: 13 10*3/uL — AB (ref 4.0–10.5)

## 2016-11-09 LAB — TYPE AND SCREEN
ABO/RH(D): A POS
ANTIBODY SCREEN: NEGATIVE

## 2016-11-09 LAB — ABO/RH: ABO/RH(D): A POS

## 2016-11-09 MED ORDER — SOD CITRATE-CITRIC ACID 500-334 MG/5ML PO SOLN: 30.0000 mL | ORAL | Status: DC | PRN

## 2016-11-09 MED ORDER — ACETAMINOPHEN 325 MG PO TABS: 650.0000 mg | ORAL_TABLET | ORAL | Status: DC | PRN

## 2016-11-09 MED ORDER — COCONUT OIL OIL
1.0000 "application " | TOPICAL_OIL | Status: DC | PRN
  Administered 2016-11-10: 1 via TOPICAL
  Filled 2016-11-09: qty 120

## 2016-11-09 MED ORDER — OXYCODONE-ACETAMINOPHEN 5-325 MG PO TABS: 2.0000 | ORAL_TABLET | ORAL | Status: DC | PRN

## 2016-11-09 MED ORDER — BENZOCAINE-MENTHOL 20-0.5 % EX AERO
1.0000 "application " | INHALATION_SPRAY | CUTANEOUS | Status: DC | PRN
  Administered 2016-11-09: 1 via TOPICAL
  Filled 2016-11-09: qty 56

## 2016-11-09 MED ORDER — PHENYLEPHRINE 40 MCG/ML (10ML) SYRINGE FOR IV PUSH (FOR BLOOD PRESSURE SUPPORT)
80.0000 ug | PREFILLED_SYRINGE | INTRAVENOUS | Status: DC | PRN
  Filled 2016-11-09: qty 5

## 2016-11-09 MED ORDER — EPHEDRINE 5 MG/ML INJ
10.0000 mg | INTRAVENOUS | Status: DC | PRN
  Filled 2016-11-09: qty 4

## 2016-11-09 MED ORDER — IBUPROFEN 600 MG PO TABS
600.0000 mg | ORAL_TABLET | Freq: Four times a day (QID) | ORAL | Status: DC
  Administered 2016-11-09 – 2016-11-11 (×7): 600 mg via ORAL
  Filled 2016-11-09 (×7): qty 1

## 2016-11-09 MED ORDER — FENTANYL 2.5 MCG/ML BUPIVACAINE 1/10 % EPIDURAL INFUSION (WH - ANES)
14.0000 mL/h | INTRAMUSCULAR | Status: DC | PRN
  Administered 2016-11-09: 14 mL/h via EPIDURAL
  Filled 2016-11-09: qty 100

## 2016-11-09 MED ORDER — WITCH HAZEL-GLYCERIN EX PADS
1.0000 "application " | MEDICATED_PAD | CUTANEOUS | Status: DC | PRN
  Administered 2016-11-09: 1 via TOPICAL

## 2016-11-09 MED ORDER — ONDANSETRON HCL 4 MG/2ML IJ SOLN: 4.0000 mg | Freq: Four times a day (QID) | INTRAMUSCULAR | Status: DC | PRN

## 2016-11-09 MED ORDER — ENOXAPARIN SODIUM 40 MG/0.4ML ~~LOC~~ SOLN
40.0000 mg | SUBCUTANEOUS | Status: DC
  Administered 2016-11-11: 40 mg via SUBCUTANEOUS
  Filled 2016-11-09 (×2): qty 0.4

## 2016-11-09 MED ORDER — LIDOCAINE HCL (PF) 1 % IJ SOLN
30.0000 mL | INTRAMUSCULAR | Status: DC | PRN
  Filled 2016-11-09: qty 30

## 2016-11-09 MED ORDER — PHENYLEPHRINE 40 MCG/ML (10ML) SYRINGE FOR IV PUSH (FOR BLOOD PRESSURE SUPPORT)
80.0000 ug | PREFILLED_SYRINGE | INTRAVENOUS | Status: DC | PRN
  Filled 2016-11-09: qty 5
  Filled 2016-11-09: qty 10

## 2016-11-09 MED ORDER — OXYCODONE-ACETAMINOPHEN 5-325 MG PO TABS: 1.0000 | ORAL_TABLET | ORAL | Status: DC | PRN

## 2016-11-09 MED ORDER — OXYTOCIN BOLUS FROM INFUSION: 500.0000 mL | Freq: Once | INTRAVENOUS | Status: DC

## 2016-11-09 MED ORDER — DIPHENHYDRAMINE HCL 50 MG/ML IJ SOLN: 12.5000 mg | INTRAMUSCULAR | Status: DC | PRN

## 2016-11-09 MED ORDER — OXYTOCIN 40 UNITS IN LACTATED RINGERS INFUSION - SIMPLE MED: 2.5000 [IU]/h | INTRAVENOUS | Status: DC

## 2016-11-09 MED ORDER — MISOPROSTOL 25 MCG QUARTER TABLET
25.0000 ug | ORAL_TABLET | ORAL | Status: DC
  Administered 2016-11-09: 25 ug via VAGINAL
  Filled 2016-11-09: qty 0.25
  Filled 2016-11-09 (×5): qty 1

## 2016-11-09 MED ORDER — LACTATED RINGERS IV SOLN
INTRAVENOUS | Status: DC
  Administered 2016-11-09 (×3): via INTRAVENOUS

## 2016-11-09 MED ORDER — ONDANSETRON HCL 4 MG PO TABS: 4.0000 mg | ORAL_TABLET | ORAL | Status: DC | PRN

## 2016-11-09 MED ORDER — LACTATED RINGERS IV SOLN: 500.0000 mL | Freq: Once | INTRAVENOUS | Status: DC

## 2016-11-09 MED ORDER — DIPHENHYDRAMINE HCL 25 MG PO CAPS: 25.0000 mg | ORAL_CAPSULE | Freq: Four times a day (QID) | ORAL | Status: DC | PRN

## 2016-11-09 MED ORDER — LIDOCAINE HCL (PF) 1 % IJ SOLN
INTRAMUSCULAR | Status: DC | PRN
  Administered 2016-11-09: 5 mL via EPIDURAL
  Administered 2016-11-09: 4 mL via EPIDURAL

## 2016-11-09 MED ORDER — MEDROXYPROGESTERONE ACETATE 150 MG/ML IM SUSP: 150.0000 mg | INTRAMUSCULAR | Status: DC | PRN

## 2016-11-09 MED ORDER — SIMETHICONE 80 MG PO CHEW: 80.0000 mg | CHEWABLE_TABLET | ORAL | Status: DC | PRN

## 2016-11-09 MED ORDER — MEASLES, MUMPS & RUBELLA VAC ~~LOC~~ INJ: 0.5000 mL | INJECTION | Freq: Once | SUBCUTANEOUS | Status: DC

## 2016-11-09 MED ORDER — DIBUCAINE 1 % RE OINT: 1.0000 "application " | TOPICAL_OINTMENT | RECTAL | Status: DC | PRN

## 2016-11-09 MED ORDER — TERBUTALINE SULFATE 1 MG/ML IJ SOLN
0.2500 mg | Freq: Once | INTRAMUSCULAR | Status: DC | PRN
  Filled 2016-11-09: qty 1

## 2016-11-09 MED ORDER — PRENATAL MULTIVITAMIN CH
1.0000 | ORAL_TABLET | Freq: Every day | ORAL | Status: DC
  Administered 2016-11-10: 1 via ORAL
  Filled 2016-11-09: qty 1

## 2016-11-09 MED ORDER — SENNOSIDES-DOCUSATE SODIUM 8.6-50 MG PO TABS
2.0000 | ORAL_TABLET | ORAL | Status: DC
  Administered 2016-11-10 (×2): 2 via ORAL
  Filled 2016-11-09 (×2): qty 2

## 2016-11-09 MED ORDER — OXYCODONE-ACETAMINOPHEN 5-325 MG PO TABS
2.0000 | ORAL_TABLET | ORAL | Status: DC | PRN
Start: 2016-11-09 — End: 2016-11-11

## 2016-11-09 MED ORDER — TETANUS-DIPHTH-ACELL PERTUSSIS 5-2.5-18.5 LF-MCG/0.5 IM SUSP: 0.5000 mL | Freq: Once | INTRAMUSCULAR | Status: DC

## 2016-11-09 MED ORDER — ONDANSETRON HCL 4 MG/2ML IJ SOLN: 4.0000 mg | INTRAMUSCULAR | Status: DC | PRN

## 2016-11-09 MED ORDER — OXYTOCIN 40 UNITS IN LACTATED RINGERS INFUSION - SIMPLE MED
1.0000 m[IU]/min | INTRAVENOUS | Status: DC
  Administered 2016-11-09: 2 m[IU]/min via INTRAVENOUS
  Filled 2016-11-09: qty 1000

## 2016-11-09 MED ORDER — LACTATED RINGERS IV SOLN: 500.0000 mL | INTRAVENOUS | Status: DC | PRN

## 2016-11-09 NOTE — Progress Notes (Signed)
SVD of vigorous female infant w/ apgars of 9,9.  Placenta delivered spontaneous w/ 3VC.   2nd degree lac repaired w/ 3-0 vicryl rapide.  Fundus firm.  EBL 250cc .   

## 2016-11-09 NOTE — Anesthesia Procedure Notes (Signed)
Epidural Patient location during procedure: OB Start time: 11/09/2016 9:24 AM  Staffing Anesthesiologist: Josephine Igo Performed: anesthesiologist   Preanesthetic Checklist Completed: patient identified, site marked, surgical consent, pre-op evaluation, timeout performed, IV checked, risks and benefits discussed and monitors and equipment checked  Epidural Patient position: sitting Prep: site prepped and draped and DuraPrep Patient monitoring: continuous pulse ox and blood pressure Approach: midline Location: L3-L4 Injection technique: LOR air  Needle:  Needle type: Tuohy  Needle gauge: 17 G Needle length: 9 cm and 9 Needle insertion depth: 5 cm cm Catheter type: closed end flexible Catheter size: 19 Gauge Catheter at skin depth: 10 cm Test dose: negative and Other  Assessment Events: blood not aspirated, injection not painful, no injection resistance, negative IV test and no paresthesia  Additional Notes Patient identified. Risks and benefits discussed including failed block, incomplete  Pain control, post dural puncture headache, nerve damage, paralysis, blood pressure Changes, nausea, vomiting, reactions to medications-both toxic and allergic and post Partum back pain. All questions were answered. Patient expressed understanding and wished to proceed. Sterile technique was used throughout procedure. Epidural site was Dressed with sterile barrier dressing. No paresthesias, signs of intravascular injection Or signs of intrathecal spread were encountered.  Patient was more comfortable after the epidural was dosed. Please see RN's note for documentation of vital signs and FHR which are stable.

## 2016-11-09 NOTE — Anesthesia Pain Management Evaluation Note (Signed)
  CRNA Pain Management Visit Note  Patient: Desiree Reed, 36 y.o., female  "Hello I am a member of the anesthesia team at [facility]. We have an anesthesia team available at all times to provide care throughout the hospital, including epidural management and anesthesia for C-section. I don't know your plan for the delivery whether it a natural birth, water birth, IV sedation, nitrous supplementation, doula or epidural, but we want to meet your pain goals."   1.Was your pain managed to your expectations on prior hospitalizations?   No prior hospitalizations  2.What is your expectation for pain management during this hospitalization?     Epidural  3.How can we help you reach that goal? Epidural when needed  Record the patient's initial score and the patient's pain goal.   Pain: 4  Pain Goal: 6 [facility] wants you to be able to say your pain was always managed very well.  Syris Brookens 11/09/2016

## 2016-11-09 NOTE — Progress Notes (Signed)
Pt feeling mild ctx  FHT cat 1 Toco Q2-4 Cvx 3/70/-2 AROM - clear  A/P:  Start pitocin SCDs

## 2016-11-09 NOTE — Anesthesia Preprocedure Evaluation (Addendum)
Anesthesia Evaluation  Patient identified by MRN, date of birth, ID band Patient awake    Reviewed: Allergy & Precautions, Patient's Chart, lab work & pertinent test results  History of Anesthesia Complications (+) AWARENESS UNDER ANESTHESIA  Airway Mallampati: II       Dental no notable dental hx. (+) Teeth Intact   Pulmonary asthma ,    Pulmonary exam normal breath sounds clear to auscultation       Cardiovascular + Peripheral Vascular Disease  Normal cardiovascular exam Rhythm:Regular Rate:Normal  Hx/o DVT and PE in 2013 - was on OCP at the time, took Xarelto for 12 mos. Hematology w/u negative- started on Lovenox in 1st trimester, last dose 2/25   Neuro/Psych negative neurological ROS  negative psych ROS   GI/Hepatic Neg liver ROS, GERD  ,  Endo/Other  Obesity  Renal/GU negative Renal ROS  negative genitourinary   Musculoskeletal negative musculoskeletal ROS (+)   Abdominal (+) + obese,   Peds  Hematology negative hematology ROS (+)   Anesthesia Other Findings   Reproductive/Obstetrics (+) Pregnancy AMA                            Anesthesia Physical Anesthesia Plan  ASA: II  Anesthesia Plan: Epidural   Post-op Pain Management:    Induction:   Airway Management Planned: Natural Airway  Additional Equipment:   Intra-op Plan:   Post-operative Plan:   Informed Consent: I have reviewed the patients History and Physical, chart, labs and discussed the procedure including the risks, benefits and alternatives for the proposed anesthesia with the patient or authorized representative who has indicated his/her understanding and acceptance.   Dental advisory given  Plan Discussed with: Anesthesiologist  Anesthesia Plan Comments:         Anesthesia Quick Evaluation

## 2016-11-09 NOTE — Lactation Note (Signed)
This note was copied from a baby's chart. Lactation Consultation Note  Patient Name: Desiree Reed Today's Date: 11/09/2016 Reason for consult: Initial assessment Baby at 8 hr of life. Mom is worried that baby is not latching well. Upon entry baby was latched in football position on the L breast. Mom stated the latch felt like it was pinching. When baby came off there was a horizontal compression stripe. Adjusted baby's position at the breast and showed parents how to compress the breast to get a deeper latch. Mom reported this latch felt "much better". Discussed baby behavior, feeding frequency, baby belly size, voids, wt loss, breast changes, and nipple care. Demonstrated manual expression, colostrum noted bilaterally, spoon in room. Given lactation handouts. Aware of OP services and support group.     Maternal Data Has patient been taught Hand Expression?: Yes Does the patient have breastfeeding experience prior to this delivery?: No  Feeding Feeding Type: Breast Fed Length of feed: 30 min  LATCH Score/Interventions Latch: Repeated attempts needed to sustain latch, nipple held in mouth throughout feeding, stimulation needed to elicit sucking reflex. Intervention(s): Adjust position;Assist with latch;Breast massage;Breast compression  Audible Swallowing: A few with stimulation Intervention(s): Hand expression;Skin to skin Intervention(s): Alternate breast massage  Type of Nipple: Everted at rest and after stimulation  Comfort (Breast/Nipple): Filling, red/small blisters or bruises, mild/mod discomfort  Problem noted: Mild/Moderate discomfort Interventions (Mild/moderate discomfort): Hand expression  Hold (Positioning): Full assist, staff holds infant at breast Intervention(s): Position options;Support Pillows  LATCH Score: 5  Lactation Tools Discussed/Used     Consult Status Consult Status: Follow-up Date: 11/10/16 Follow-up type: In-patient    Denzil Hughes 11/09/2016, 10:43 PM

## 2016-11-09 NOTE — Anesthesia Postprocedure Evaluation (Signed)
Anesthesia Post Note  Patient: Desiree Reed  Procedure(s) Performed: * No procedures listed *  Patient location during evaluation: Mother Baby Anesthesia Type: Epidural Level of consciousness: awake and alert Pain management: pain level controlled Vital Signs Assessment: post-procedure vital signs reviewed and stable Respiratory status: spontaneous breathing, nonlabored ventilation and respiratory function stable Cardiovascular status: stable Postop Assessment: no headache, no backache and epidural receding Anesthetic complications: no        Last Vitals:  Vitals:   11/09/16 1620 11/09/16 1720  BP: 114/70 110/60  Pulse: 60 63  Resp: 18 18  Temp: 36.8 C 36.8 C    Last Pain:  Vitals:   11/09/16 1720  TempSrc: Oral  PainSc: 4    Pain Goal: Patients Stated Pain Goal: 2 (11/09/16 1620)               Gilmer Mor

## 2016-11-10 LAB — CBC
HCT: 31.9 % — ABNORMAL LOW (ref 36.0–46.0)
HEMOGLOBIN: 11.6 g/dL — AB (ref 12.0–15.0)
MCH: 33.1 pg (ref 26.0–34.0)
MCHC: 36.4 g/dL — ABNORMAL HIGH (ref 30.0–36.0)
MCV: 91.1 fL (ref 78.0–100.0)
Platelets: 179 10*3/uL (ref 150–400)
RBC: 3.5 MIL/uL — AB (ref 3.87–5.11)
RDW: 13 % (ref 11.5–15.5)
WBC: 16.3 10*3/uL — AB (ref 4.0–10.5)

## 2016-11-10 LAB — RPR: RPR: NONREACTIVE

## 2016-11-10 NOTE — Lactation Note (Signed)
This note was copied from a baby's chart. Lactation Consultation Note Follow up visit at 26 hours of age.  Mom reports that RN assisted with latching and baby has been feeding for about 10 minutes.  Lc observed good latch.  LC encouraged mom to hold breast and compress during feedings to keep baby active.  Baby is sleepy, but will maintain feeding well with stimulation.  Baby fed for 20 minutes, mom instructed on how to remove baby from breast when baby is asleep and latched.  Nipple noted to be slightly compressed.  LC encouraged mom to hand express prior to latching, and after to apply colostrum to nipple.  Mom denies further concerns at this time.   FOB at bedside supportive.    Patient Name: Desiree Reed Today's Date: 11/10/2016     Maternal Data    Feeding Feeding Type: Breast Milk Length of feed: 25 min  LATCH Score/Interventions                      Lactation Tools Discussed/Used     Consult Status      [place]w, Justine Null 11/10/2016, 4:53 PM

## 2016-11-10 NOTE — Progress Notes (Signed)
Post Partum Day 1 Subjective: no complaints, up ad lib, voiding and tolerating PO  Objective: Blood pressure 110/60, pulse 63, temperature 98.2 F (36.8 C), temperature source Oral, resp. rate 18, height 5\' 8"  (1.727 m), weight 211 lb (95.7 kg), last menstrual period 01/20/2016, SpO2 97 %, unknown if currently breastfeeding.  Physical Exam:  General: alert, cooperative, appears stated age and no distress Lochia: appropriate Uterine Fundus: firm Incision: healing well DVT Evaluation: No evidence of DVT seen on physical exam.   Recent Labs  11/09/16 0140 11/10/16 0534  HGB 12.7 11.6*  HCT 35.8* 31.9*    Assessment/Plan: Plan for discharge tomorrow and Breastfeeding  Hx of PE/DVT. Will restart Lovenox in am and cont x 6 weeks (per Dr Royce Macadamia to wait 24h)   LOS: 1 day   Jentry Warnell C 11/10/2016, 9:22 AM

## 2016-11-11 ENCOUNTER — Encounter (HOSPITAL_COMMUNITY): Payer: Self-pay

## 2016-11-11 MED ORDER — ACETAMINOPHEN 325 MG PO TABS: 650.0000 mg | ORAL_TABLET | ORAL | 1 refills | Status: DC | PRN

## 2016-11-11 NOTE — Discharge Summary (Signed)
Obstetric Discharge Summary Reason for Admission: induction of labor Prenatal Procedures: none Intrapartum Procedures: spontaneous vaginal delivery Postpartum Procedures: none Complications-Operative and Postpartum: none Hemoglobin  Date Value Ref Range Status  11/10/2016 11.6 (L) 12.0 - 15.0 g/dL Final   HCT  Date Value Ref Range Status  11/10/2016 31.9 (L) 36.0 - 46.0 % Final    Physical Exam:  General: alert, cooperative and no distress Lochia: appropriate Uterine Fundus: firm Incision: healing well DVT Evaluation: No evidence of DVT seen on physical exam.  Discharge Diagnoses: Term Pregnancy-delivered  Discharge Information: Date: 11/11/2016 Activity: pelvic rest Diet: routine Medications: PNV and Lovenox Condition: stable Instructions: refer to practice specific booklet Discharge to: home   Newborn Data: Live born female  Birth Weight: 7 lb 7.8 oz (3395 g) APGAR: 9, 9  Home with mother.  Desiree Reed II,Deondre Marinaro E 11/11/2016, 11:12 AM

## 2016-11-11 NOTE — Lactation Note (Signed)
This note was copied from a baby's chart. Lactation Consultation Note  Patient Name: Desiree Reed [id number] Date: 11/11/2016 Reason for consult: Follow-up assessment;Difficult latch   Baby 28 hours old. Mom reports that the baby has been sleepy at the breast and her nipples are sore. Assisted mom with latching the baby to left breast in cross-cradle position. Assisted mom with how to position the baby and how to support the baby's head and her breast. Assisted mom with hand expression with some colostrum flowing. Baby able to latch and suckle off-and-on for 10 minutes, and mom reported comfort while baby nursing. Mom's nipple perfectly round when baby finished nursing. Mom reports that she has a DEBP at home, so enc mom to use pump in order to stimulate breast milk production since baby sleepy at breast. Mom given comfort gels with review.  Plan is for mom to latch baby with cues, then supplement with EBM/formula. Enc mom to post-pump followed by hand expression and spoon-feed additional colostrum after baby nurses. Parents report comfort with spoon-feeding. Discussed progression of milk coming to volume and supply and demand. Parents aware of OP/BFSG and [place] phone line assistance after D/C. Discussed assessment and interventions with patient's bedside nurse, Lanelle Bal, RN.   Maternal Data    Feeding Feeding Type: Breast Fed Length of feed: 10 min (Off-and-on)  LATCH Score/Interventions Latch: Grasps breast easily, tongue down, lips flanged, rhythmical sucking. Intervention(s): Adjust position;Assist with latch;Breast compression  Audible Swallowing: A few with stimulation Intervention(s): Skin to skin;Hand expression  Type of Nipple: Everted at rest and after stimulation  Comfort (Breast/Nipple): Filling, red/small blisters or bruises, mild/mod discomfort  Problem noted: Mild/Moderate discomfort Interventions (Mild/moderate discomfort): Comfort gels;Hand expression;Post-pump  Hold  (Positioning): Assistance needed to correctly position infant at breast and maintain latch. Intervention(s): Breastfeeding basics reviewed;Support Pillows;Skin to skin  LATCH Score: 7  Lactation Tools Discussed/Used     Consult Status Consult Status: PRN    Andres Labrum 11/11/2016, 1:13 PM

## 2017-10-19 DIAGNOSIS — J029 Acute pharyngitis, unspecified: Secondary | ICD-10-CM | POA: Diagnosis not present

## 2017-10-19 DIAGNOSIS — R05 Cough: Secondary | ICD-10-CM | POA: Diagnosis not present

## 2017-11-01 DIAGNOSIS — L723 Sebaceous cyst: Secondary | ICD-10-CM | POA: Diagnosis not present

## 2017-11-17 DIAGNOSIS — R509 Fever, unspecified: Secondary | ICD-10-CM | POA: Diagnosis not present

## 2017-11-17 DIAGNOSIS — Z6828 Body mass index (BMI) 28.0-28.9, adult: Secondary | ICD-10-CM | POA: Diagnosis not present

## 2017-11-17 DIAGNOSIS — J069 Acute upper respiratory infection, unspecified: Secondary | ICD-10-CM | POA: Diagnosis not present

## 2019-04-30 ENCOUNTER — Other Ambulatory Visit: Payer: Self-pay

## 2019-04-30 DIAGNOSIS — Z20822 Contact with and (suspected) exposure to covid-19: Secondary | ICD-10-CM

## 2019-05-02 LAB — NOVEL CORONAVIRUS, NAA: SARS-CoV-2, NAA: NOT DETECTED

## 2019-09-17 ENCOUNTER — Ambulatory Visit: Payer: 59 | Attending: Internal Medicine

## 2020-12-12 LAB — HM PAP SMEAR: HM Pap smear: NEGATIVE

## 2020-12-12 LAB — RESULTS CONSOLE HPV: CHL HPV: NEGATIVE

## 2021-06-09 ENCOUNTER — Ambulatory Visit: Payer: 59 | Admitting: Orthopaedic Surgery

## 2021-07-02 ENCOUNTER — Ambulatory Visit (INDEPENDENT_AMBULATORY_CARE_PROVIDER_SITE_OTHER): Payer: BC Managed Care – PPO | Admitting: Physician Assistant

## 2021-07-02 ENCOUNTER — Encounter: Payer: Self-pay | Admitting: Physician Assistant

## 2021-07-02 ENCOUNTER — Other Ambulatory Visit: Payer: Self-pay

## 2021-07-02 VITALS — BP 107/71 | HR 62 | Temp 98.6°F | Ht 68.0 in | Wt 189.2 lb

## 2021-07-02 DIAGNOSIS — Z13220 Encounter for screening for lipoid disorders: Secondary | ICD-10-CM

## 2021-07-02 DIAGNOSIS — Z0000 Encounter for general adult medical examination without abnormal findings: Secondary | ICD-10-CM | POA: Diagnosis not present

## 2021-07-02 DIAGNOSIS — Z131 Encounter for screening for diabetes mellitus: Secondary | ICD-10-CM

## 2021-07-02 DIAGNOSIS — Z1159 Encounter for screening for other viral diseases: Secondary | ICD-10-CM | POA: Diagnosis not present

## 2021-07-02 DIAGNOSIS — D485 Neoplasm of uncertain behavior of skin: Secondary | ICD-10-CM

## 2021-07-02 LAB — CBC WITH DIFFERENTIAL/PLATELET
Basophils Absolute: 0 10*3/uL (ref 0.0–0.1)
Basophils Relative: 0.4 % (ref 0.0–3.0)
Eosinophils Absolute: 0 10*3/uL (ref 0.0–0.7)
Eosinophils Relative: 0.1 % (ref 0.0–5.0)
HCT: 41.6 % (ref 36.0–46.0)
Hemoglobin: 13.9 g/dL (ref 12.0–15.0)
Lymphocytes Relative: 21.4 % (ref 12.0–46.0)
Lymphs Abs: 1.8 10*3/uL (ref 0.7–4.0)
MCHC: 33.5 g/dL (ref 30.0–36.0)
MCV: 93.3 fl (ref 78.0–100.0)
Monocytes Absolute: 0.7 10*3/uL (ref 0.1–1.0)
Monocytes Relative: 8.6 % (ref 3.0–12.0)
Neutro Abs: 6 10*3/uL (ref 1.4–7.7)
Neutrophils Relative %: 69.5 % (ref 43.0–77.0)
Platelets: 217 10*3/uL (ref 150.0–400.0)
RBC: 4.46 Mil/uL (ref 3.87–5.11)
RDW: 13.4 % (ref 11.5–15.5)
WBC: 8.6 10*3/uL (ref 4.0–10.5)

## 2021-07-02 LAB — COMPREHENSIVE METABOLIC PANEL
ALT: 33 U/L (ref 0–35)
AST: 22 U/L (ref 0–37)
Albumin: 4.2 g/dL (ref 3.5–5.2)
Alkaline Phosphatase: 52 U/L (ref 39–117)
BUN: 13 mg/dL (ref 6–23)
CO2: 29 mEq/L (ref 19–32)
Calcium: 9 mg/dL (ref 8.4–10.5)
Chloride: 100 mEq/L (ref 96–112)
Creatinine, Ser: 0.62 mg/dL (ref 0.40–1.20)
GFR: 111.7 mL/min (ref >60.00)
Glucose, Bld: 81 mg/dL (ref 70–99)
Potassium: 3.7 mEq/L (ref 3.5–5.1)
Sodium: 137 mEq/L (ref 135–145)
Total Bilirubin: 1.4 mg/dL — ABNORMAL HIGH (ref 0.2–1.2)
Total Protein: 6.6 g/dL (ref 6.0–8.3)

## 2021-07-02 LAB — LIPID PANEL
Cholesterol: 174 mg/dL (ref 0–200)
HDL: 91.6 mg/dL (ref >39.00)
LDL Cholesterol: 74 mg/dL (ref 0–99)
NonHDL: 82.82
Total CHOL/HDL Ratio: 2
Triglycerides: 43 mg/dL (ref 0.0–149.0)
VLDL: 8.6 mg/dL (ref 0.0–40.0)

## 2021-07-02 NOTE — Patient Instructions (Signed)
Good to meet you today! Please go to the lab for blood work and I will send results through [place]. Keep up good work with taking care of your health!

## 2021-07-02 NOTE — Progress Notes (Signed)
Subjective:    Patient ID: Desiree Reed, female    DOB: June 09, 1981, 40 y.o.   MRN: [id number]  Chief Complaint  Patient presents with   Annual Exam    HPI Patient is in today for new patient establishment and annual CPE. Happily married. 19 year old daughter in pre-K.  Acute concerns: Hx of skin cancer in her family. She has a few areas to be looked at today.   Health maintenance: Lifestyle/ exercise: Walks daily and does yoga Nutrition: Mindful of diet, tracks calories  Mental health: Doing well, sees a therapist Caffeine: 2 cups coffee per day  Sleep: Could be better, usually up with 40 year old during the night  Substance use: None  Sexual activity: Monogamous with husband, dx with STD in younger years, takes Valtrex as needed Immunizations: UTD Colonoscopy: Starts at age 77 Pap: Sees GYN regularly and is UTD  Mammogram: Mother passed away at 90 from ovarian cancer, baseline mammogram for patient has been normal    Past Medical History:  Diagnosis Date   AMA (advanced maternal age) primigravida 35+    Asthma    childhood   Herpes    PE (pulmonary embolism) 2013    History reviewed. No pertinent surgical history.  Family History  Problem Relation Age of Onset   Cancer Mother    Ovarian cancer Mother    Diabetes Father    Congestive Heart Failure Father     Social History   Tobacco Use   Smoking status: Never   Smokeless tobacco: Never  Substance Use Topics   Alcohol use: Yes    Comment: 2-3 glasses of wine a week   Drug use: No     No Known Allergies  Review of Systems  Constitutional:  Negative for activity change, appetite change, fever and unexpected weight change.  HENT:  Negative for congestion.   Eyes:  Negative for visual disturbance.  Respiratory:  Negative for apnea, cough and shortness of breath.   Cardiovascular:  Negative for chest pain, palpitations and leg swelling.  Gastrointestinal:  Negative for abdominal pain, blood in stool,  constipation and diarrhea.  Endocrine: Negative for polydipsia, polyphagia and polyuria.  Genitourinary:  Negative for dysuria and pelvic pain.  Musculoskeletal:  Negative for arthralgias and back pain (recently had spasm which resolved when visiting with [facility]).  Skin:  Negative for rash.  Neurological:  Negative for dizziness, weakness and headaches.  Hematological:  Negative for adenopathy. Does not bruise/bleed easily.  Psychiatric/Behavioral:  Negative for sleep disturbance and suicidal ideas. The patient is not nervous/anxious.        Objective:     BP 107/71   Pulse 62   Temp 98.6 F (37 C)   Ht 5\' 8"  (1.727 m)   Wt 189 lb 4 oz (85.8 kg)   LMP 06/23/2021   SpO2 98%   BMI 28.78 kg/m   Wt Readings from Last 3 Encounters:  07/02/21 189 lb 4 oz (85.8 kg)  11/09/16 211 lb (95.7 kg)  07/09/13 185 lb 1.6 oz (84 kg)    BP Readings from Last 3 Encounters:  07/02/21 107/71  11/11/16 112/60  02/01/16 103/70     Physical Exam Vitals and nursing note reviewed.  Constitutional:      Appearance: Normal appearance. She is normal weight. She is not toxic-appearing.  HENT:     Head: Normocephalic and atraumatic.     Right Ear: Tympanic membrane, ear canal and external ear normal.  Left Ear: Tympanic membrane, ear canal and external ear normal.     Nose: Nose normal.     Mouth/Throat:     Mouth: Mucous membranes are moist.  Eyes:     Extraocular Movements: Extraocular movements intact.     Conjunctiva/sclera: Conjunctivae normal.     Pupils: Pupils are equal, round, and reactive to light.  Cardiovascular:     Rate and Rhythm: Normal rate and regular rhythm.     Pulses: Normal pulses.     Heart sounds: Normal heart sounds.  Pulmonary:     Effort: Pulmonary effort is normal.     Breath sounds: Normal breath sounds.  Abdominal:     General: Abdomen is flat. Bowel sounds are normal.     Palpations: Abdomen is soft.  Musculoskeletal:        General: Normal range  of motion.     Cervical back: Normal range of motion and neck supple.  Skin:    General: Skin is warm and dry.     Comments: Left anterior forearm NEW small pink papule <0.5 cm  Neurological:     General: No focal deficit present.     Mental Status: She is alert and oriented to person, place, and time.  Psychiatric:        Mood and Affect: Mood normal.        Behavior: Behavior normal.        Thought Content: Thought content normal.        Judgment: Judgment normal.       Assessment & Plan:   Problem List Items Addressed This Visit   None Visit Diagnoses     Encounter for annual physical exam    -  Primary   Relevant Orders   CBC with Differential/Platelet   Comprehensive metabolic panel   Lipid panel   Diabetes mellitus screening       Relevant Orders   Comprehensive metabolic panel   Screening for cholesterol level       Relevant Orders   Comprehensive metabolic panel   Need for hepatitis C screening test       Relevant Orders   Hepatitis C antibody   Neoplasm of uncertain behavior of skin          Age-appropriate screening and counseling performed today. Will check labs and call with results. Preventive measures discussed and printed in AVS for patient.  Regular f/up with GYN. UTD with preventive screenings. Regular dental /vision exams. Will consider shave biopsy new papule left forearm.    This note was prepared with assistance of Systems analyst. Occasional wrong-word or sound-a-like substitutions may have occurred due to the inherent limitations of voice recognition software.   Reyes Aldaco M Compton Brigance, PA-C

## 2021-07-03 LAB — HEPATITIS C ANTIBODY
Hepatitis C Ab: NONREACTIVE
SIGNAL TO CUT-OFF: 0.15 (ref <1.00)

## 2021-07-10 ENCOUNTER — Ambulatory Visit: Payer: BC Managed Care – PPO | Admitting: Physician Assistant

## 2021-07-15 ENCOUNTER — Other Ambulatory Visit: Payer: Self-pay

## 2021-07-15 ENCOUNTER — Ambulatory Visit (INDEPENDENT_AMBULATORY_CARE_PROVIDER_SITE_OTHER): Payer: BC Managed Care – PPO | Admitting: Physician Assistant

## 2021-07-15 ENCOUNTER — Encounter: Payer: Self-pay | Admitting: Physician Assistant

## 2021-07-15 VITALS — BP 110/73 | HR 60 | Temp 97.4°F | Ht 68.0 in | Wt 193.0 lb

## 2021-07-15 DIAGNOSIS — D485 Neoplasm of uncertain behavior of skin: Secondary | ICD-10-CM

## 2021-07-15 DIAGNOSIS — D239 Other benign neoplasm of skin, unspecified: Secondary | ICD-10-CM | POA: Diagnosis not present

## 2021-07-15 NOTE — Progress Notes (Signed)
Subjective:    Patient ID: Desiree Reed, female    DOB: 1981-04-30, 40 y.o.   MRN: [id number]  No chief complaint on file.   HPI Patient is in today for shave biopsy new papular lesion left forearm. It is not bothering her. No pain or itching. She has not had any prior personal hx of skin cancer.  Past Medical History:  Diagnosis Date   AMA (advanced maternal age) primigravida 35+    Asthma    childhood   Herpes    PE (pulmonary embolism) 2013    History reviewed. No pertinent surgical history.  Family History  Problem Relation Age of Onset   Cancer Mother    Ovarian cancer Mother    Diabetes Father    Congestive Heart Failure Father     Social History   Tobacco Use   Smoking status: Never   Smokeless tobacco: Never  Substance Use Topics   Alcohol use: Yes    Comment: 2-3 glasses of wine a week   Drug use: No     No Known Allergies  Review of Systems REFER TO HPI FOR PERTINENT POSITIVES AND NEGATIVES      Objective:     BP 110/73   Pulse 60   Temp (!) 97.4 F (36.3 C)   Ht 5\' 8"  (1.727 m)   Wt 193 lb (87.5 kg)   LMP 06/23/2021   SpO2 99%   BMI 29.35 kg/m   Wt Readings from Last 3 Encounters:  07/15/21 193 lb (87.5 kg)  07/02/21 189 lb 4 oz (85.8 kg)  11/09/16 211 lb (95.7 kg)    BP Readings from Last 3 Encounters:  07/15/21 110/73  07/02/21 107/71  11/11/16 112/60     Physical Exam Vitals and nursing note reviewed.  Constitutional:      Appearance: Normal appearance. She is normal weight. She is not toxic-appearing.  HENT:     Head: Normocephalic and atraumatic.     Right Ear: External ear normal.     Left Ear: External ear normal.     Nose: Nose normal.     Mouth/Throat:     Mouth: Mucous membranes are moist.  Eyes:     Extraocular Movements: Extraocular movements intact.     Conjunctiva/sclera: Conjunctivae normal.     Pupils: Pupils are equal, round, and reactive to light.  Cardiovascular:     Rate and Rhythm: Normal  rate and regular rhythm.     Pulses: Normal pulses.     Heart sounds: Normal heart sounds.  Pulmonary:     Effort: Pulmonary effort is normal.     Breath sounds: Normal breath sounds.  Musculoskeletal:        General: Normal range of motion.     Cervical back: Normal range of motion and neck supple.  Skin:    General: Skin is warm and dry.     Comments: Left anterior forearm NEW small pink papule <0.5 cm  Neurological:     General: No focal deficit present.     Mental Status: She is alert and oriented to person, place, and time.  Psychiatric:        Mood and Affect: Mood normal.        Behavior: Behavior normal.        Thought Content: Thought content normal.        Judgment: Judgment normal.       Assessment & Plan:   Problem List Items Addressed This Visit   None  Visit Diagnoses     Neoplasm of uncertain behavior of skin    -  Primary   Relevant Orders   Cytology - non gyn       1. Neoplasm of uncertain behavior of skin Procedure explained and consent obtained. Left forearm area cleaned in usual manner. Local anesthesia with 1 cc of Lidocaine 1% with Epi, using a 25 gauge needle. Dermablade used to remove the lesion from left forearm. Hemostasis with Drysol. Band-Aid with Vaseline applied. Patient tolerated procedure well. Aftercare instructions provided. Lesion sent for pathology.    Analee Montee M Eran Mistry, PA-C

## 2021-08-14 ENCOUNTER — Ambulatory Visit: Payer: BC Managed Care – PPO | Admitting: Physician Assistant

## 2021-10-15 ENCOUNTER — Telehealth (INDEPENDENT_AMBULATORY_CARE_PROVIDER_SITE_OTHER): Payer: BC Managed Care – PPO | Admitting: Physician Assistant

## 2021-10-15 ENCOUNTER — Encounter: Payer: Self-pay | Admitting: Physician Assistant

## 2021-10-15 VITALS — Temp 97.8°F | Ht 68.0 in

## 2021-10-15 DIAGNOSIS — U071 COVID-19: Secondary | ICD-10-CM | POA: Diagnosis not present

## 2021-10-15 MED ORDER — NIRMATRELVIR/RITONAVIR (PAXLOVID)TABLET: 3.0000 | ORAL_TABLET | Freq: Two times a day (BID) | ORAL | 0 refills | Status: AC

## 2021-10-15 NOTE — Progress Notes (Signed)
° °  Virtual Visit via Video Note  I connected with  Desiree Reed  on 10/15/21 at  7:30 AM EST by a video enabled telemedicine application and verified that I am speaking with the correct person using two identifiers.  Location: Patient: home Provider: Therapist, music at [place] present: Patient and myself   I discussed the limitations of evaluation and management by telemedicine and the availability of in person appointments. The patient expressed understanding and agreed to proceed.   History of Present Illness:  Chief complaint: COVID-19 positive Symptom onset: 10/13/21 Pertinent positives: Fatigue, body aches, sinus pressure, congestion, cough Pertinent negatives: Fever, headache, ST, N/V/D, abd pain Treatments tried: Tylenol Vaccine status: Last COVID-19 vaccine was in Sept 2022 Sick exposure: Husband had COVID-19 two weeks ago, did well with Paxlovid. Pt requesting antiviral.    Observations/Objective:   Gen: Awake, alert, no acute distress, sitting upright, congested sounding Resp: Breathing is even and non-labored; some cough Psych: calm/pleasant demeanor Neuro: Alert and Oriented x 3, + facial symmetry, speech is clear.   Assessment and Plan:  1. COVID-19 Diagnosis confirmed via home antigen test.  Patient is currently having mild symptoms.  We discussed current algorithm recommendations for prescribing outpatient antivirals. Risks versus benefits discussed. Will send Rx for Paxlovid, but discussed possible side effects as well. Pt knows to start this in the first 5 days of symptom onset. Advised self-isolation at home for the next 5 days and then masking around others for at least an additional 5 days.  Treat supportively at this time as well including sleeping prone, deep breathing exercises, pushing fluids, walking every few hours, vitamins C and D, and Tylenol or ibuprofen as needed.  The patient understands that COVID-19 illness can wax and  wane.  Should the symptoms acutely worsen or patient starts to experience sudden shortness of breath, chest pain, severe weakness, the patient will go straight to the emergency department.  Also advised home pulse oximetry monitoring and for any reading consistently under 92%, should also report to the emergency department.  The patient will continue to keep [place] updated.    Follow Up Instructions:    I discussed the assessment and treatment plan with the patient. The patient was provided an opportunity to ask questions and all were answered. The patient agreed with the plan and demonstrated an understanding of the instructions.   The patient was advised to call back or seek an in-person evaluation if the symptoms worsen or if the condition fails to improve as anticipated.  Andras Grunewald M Burnice Oestreicher, PA-C

## 2021-10-23 ENCOUNTER — Other Ambulatory Visit: Payer: Self-pay | Admitting: Physician Assistant

## 2021-10-23 MED ORDER — HYDROXYZINE PAMOATE 25 MG PO CAPS: 25.0000 mg | ORAL_CAPSULE | Freq: Three times a day (TID) | ORAL | 0 refills | Status: DC | PRN

## 2022-01-12 ENCOUNTER — Telehealth: Payer: Self-pay | Admitting: Physician Assistant

## 2022-01-12 NOTE — Telephone Encounter (Signed)
Patient stated she got a bill for $134- she wants to make sure insurance paid their part- stated she shouldn't owe anything -  ?

## 2022-01-13 NOTE — Telephone Encounter (Signed)
Spoke to patient.  She has a deductible based ins.  Patient understood.

## 2022-02-16 DIAGNOSIS — Z01419 Encounter for gynecological examination (general) (routine) without abnormal findings: Secondary | ICD-10-CM | POA: Diagnosis not present

## 2022-02-16 DIAGNOSIS — Z8041 Family history of malignant neoplasm of ovary: Secondary | ICD-10-CM | POA: Diagnosis not present

## 2022-02-16 DIAGNOSIS — Z1231 Encounter for screening mammogram for malignant neoplasm of breast: Secondary | ICD-10-CM | POA: Diagnosis not present

## 2022-02-16 DIAGNOSIS — Z6829 Body mass index (BMI) 29.0-29.9, adult: Secondary | ICD-10-CM | POA: Diagnosis not present

## 2022-02-16 DIAGNOSIS — Z8051 Family history of malignant neoplasm of kidney: Secondary | ICD-10-CM | POA: Diagnosis not present

## 2022-02-18 ENCOUNTER — Other Ambulatory Visit: Payer: Self-pay | Admitting: Obstetrics and Gynecology

## 2022-02-18 DIAGNOSIS — R928 Other abnormal and inconclusive findings on diagnostic imaging of breast: Secondary | ICD-10-CM

## 2022-03-01 ENCOUNTER — Ambulatory Visit: Payer: BC Managed Care – PPO

## 2022-03-01 ENCOUNTER — Ambulatory Visit
Admission: RE | Admit: 2022-03-01 | Discharge: 2022-03-01 | Disposition: A | Discharge status: Home / Self Care | Payer: BC Managed Care – PPO | Source: Ambulatory Visit | Attending: Obstetrics and Gynecology | Admitting: Obstetrics and Gynecology

## 2022-03-01 DIAGNOSIS — R928 Other abnormal and inconclusive findings on diagnostic imaging of breast: Secondary | ICD-10-CM

## 2022-03-01 DIAGNOSIS — R922 Inconclusive mammogram: Secondary | ICD-10-CM | POA: Diagnosis not present

## 2022-03-01 LAB — HM MAMMOGRAPHY

## 2022-04-07 DIAGNOSIS — Z30430 Encounter for insertion of intrauterine contraceptive device: Secondary | ICD-10-CM | POA: Diagnosis not present

## 2022-05-28 DIAGNOSIS — Z30431 Encounter for routine checking of intrauterine contraceptive device: Secondary | ICD-10-CM | POA: Diagnosis not present

## 2022-06-07 ENCOUNTER — Encounter: Payer: Self-pay | Admitting: *Deleted

## 2022-07-05 ENCOUNTER — Encounter: Payer: Self-pay | Admitting: Physician Assistant

## 2022-07-05 ENCOUNTER — Ambulatory Visit (INDEPENDENT_AMBULATORY_CARE_PROVIDER_SITE_OTHER): Payer: BC Managed Care – PPO | Admitting: Physician Assistant

## 2022-07-05 VITALS — BP 102/66 | HR 94 | Temp 97.7°F | Ht 68.0 in | Wt 194.2 lb

## 2022-07-05 DIAGNOSIS — Z23 Encounter for immunization: Secondary | ICD-10-CM

## 2022-07-05 DIAGNOSIS — F439 Reaction to severe stress, unspecified: Secondary | ICD-10-CM

## 2022-07-05 DIAGNOSIS — Z0000 Encounter for general adult medical examination without abnormal findings: Secondary | ICD-10-CM | POA: Diagnosis not present

## 2022-07-05 LAB — TSH: TSH: 1.24 u[IU]/mL (ref 0.35–5.50)

## 2022-07-05 LAB — LIPID PANEL
Cholesterol: 160 mg/dL (ref 0–200)
HDL: 71.7 mg/dL (ref >39.00)
LDL Cholesterol: 75 mg/dL (ref 0–99)
NonHDL: 88.04
Total CHOL/HDL Ratio: 2
Triglycerides: 63 mg/dL (ref 0.0–149.0)
VLDL: 12.6 mg/dL (ref 0.0–40.0)

## 2022-07-05 LAB — CBC WITH DIFFERENTIAL/PLATELET
Basophils Absolute: 0 10*3/uL (ref 0.0–0.1)
Basophils Relative: 0.4 % (ref 0.0–3.0)
Eosinophils Absolute: 0 10*3/uL (ref 0.0–0.7)
Eosinophils Relative: 0.4 % (ref 0.0–5.0)
HCT: 40.1 % (ref 36.0–46.0)
Hemoglobin: 13.6 g/dL (ref 12.0–15.0)
Lymphocytes Relative: 28.5 % (ref 12.0–46.0)
Lymphs Abs: 2 10*3/uL (ref 0.7–4.0)
MCHC: 33.9 g/dL (ref 30.0–36.0)
MCV: 91.5 fl (ref 78.0–100.0)
Monocytes Absolute: 0.7 10*3/uL (ref 0.1–1.0)
Monocytes Relative: 9.9 % (ref 3.0–12.0)
Neutro Abs: 4.2 10*3/uL (ref 1.4–7.7)
Neutrophils Relative %: 60.8 % (ref 43.0–77.0)
Platelets: 222 10*3/uL (ref 150.0–400.0)
RBC: 4.38 Mil/uL (ref 3.87–5.11)
RDW: 13.4 % (ref 11.5–15.5)
WBC: 7 10*3/uL (ref 4.0–10.5)

## 2022-07-05 LAB — COMPREHENSIVE METABOLIC PANEL
ALT: 22 U/L (ref 0–35)
AST: 21 U/L (ref 0–37)
Albumin: 4.2 g/dL (ref 3.5–5.2)
Alkaline Phosphatase: 48 U/L (ref 39–117)
BUN: 10 mg/dL (ref 6–23)
CO2: 27 mEq/L (ref 19–32)
Calcium: 9 mg/dL (ref 8.4–10.5)
Chloride: 104 mEq/L (ref 96–112)
Creatinine, Ser: 0.56 mg/dL (ref 0.40–1.20)
GFR: 113.66 mL/min (ref >60.00)
Glucose, Bld: 83 mg/dL (ref 70–99)
Potassium: 4 mEq/L (ref 3.5–5.1)
Sodium: 139 mEq/L (ref 135–145)
Total Bilirubin: 1.8 mg/dL — ABNORMAL HIGH (ref 0.2–1.2)
Total Protein: 6.2 g/dL (ref 6.0–8.3)

## 2022-07-05 NOTE — Patient Instructions (Addendum)
Labs and flu shot today  Keep up good work!   Call [phone] to schedule with Dr. Theodis Shove.

## 2022-07-05 NOTE — Progress Notes (Signed)
Subjective:    Patient ID: Desiree Reed, female    DOB: Dec 15, 1980, 41 y.o.   MRN: [id number]  Chief Complaint  Patient presents with   Annual Exam    Pt in for annual exam; pt had coffee this morning with creamer this morning; pt has no concerns to discuss; pt getting flu vaccine today; up to date on TDAP at [place] office when pregnant; requesting last Pap results and date of TDAP     HPI Patient is in today for annual exam.  Acute concerns: Would like to meet with a counselor to discuss regular stress; has lost both parents  Health maintenance: Lifestyle/ exercise: Working with Physiological scientist, 5 days per week, strength training Nutrition: 1500 calories per day, counting macros, doing well Mental health: Doing better overall  Sleep: Doing better - tried hydroxyzine, but it made her too tired the next day; herbal supplements at night  Substance use: None  ETOH: 4-5 beverages / per week  Sexual activity: Monogamous, no concerns  Immunizations: Flu shot today  Colonoscopy: Start at age 10  Pap: UTD with GYN, has an IUD  Mammogram: UTD June 2023    Past Medical History:  Diagnosis Date   AMA (advanced maternal age) primigravida 35+    Asthma    childhood   Herpes    PE (pulmonary embolism) 2013    History reviewed. No pertinent surgical history.  Family History  Problem Relation Age of Onset   Cancer Mother    Ovarian cancer Mother    Diabetes Father        complications, leg amputation   Congestive Heart Failure Father    Cancer Father    Kidney disease Father    Dysmenorrhea Sister     Social History   Tobacco Use   Smoking status: Former    Packs/day: 0.00    Years: 10.00    Total pack years: 0.00    Types: Cigarettes    Quit date: 09/13/2012    Years since quitting: 9.8   Smokeless tobacco: Never  Substance Use Topics   Alcohol use: Yes    Alcohol/week: 4.0 standard drinks of alcohol    Types: 4 Glasses of wine per week    Comment: 2-3  glasses of wine a week   Drug use: No     No Known Allergies  Review of Systems NEGATIVE UNLESS OTHERWISE INDICATED IN HPI      Objective:     BP 102/66 (BP Location: Left Arm)   Pulse 94   Temp 97.7 F (36.5 C) (Temporal)   Ht '5\' 8"'$  (1.727 m)   Wt 194 lb 3.2 oz (88.1 kg)   LMP 05/14/2022 (Approximate)   SpO2 98%   Breastfeeding No   BMI 29.53 kg/m   Wt Readings from Last 3 Encounters:  07/05/22 194 lb 3.2 oz (88.1 kg)  07/15/21 193 lb (87.5 kg)  07/02/21 189 lb 4 oz (85.8 kg)    BP Readings from Last 3 Encounters:  07/05/22 102/66  07/15/21 110/73  07/02/21 107/71     Physical Exam Vitals and nursing note reviewed.  Constitutional:      Appearance: Normal appearance. She is normal weight. She is not toxic-appearing.  HENT:     Head: Normocephalic and atraumatic.     Right Ear: Tympanic membrane, ear canal and external ear normal.     Left Ear: Tympanic membrane, ear canal and external ear normal.     Nose: Nose normal.  Mouth/Throat:     Mouth: Mucous membranes are moist.  Eyes:     Extraocular Movements: Extraocular movements intact.     Conjunctiva/sclera: Conjunctivae normal.     Pupils: Pupils are equal, round, and reactive to light.  Cardiovascular:     Rate and Rhythm: Normal rate and regular rhythm.     Pulses: Normal pulses.     Heart sounds: Normal heart sounds.  Pulmonary:     Effort: Pulmonary effort is normal.     Breath sounds: Normal breath sounds.  Abdominal:     General: Abdomen is flat. Bowel sounds are normal.     Palpations: Abdomen is soft.  Musculoskeletal:        General: Normal range of motion.     Cervical back: Normal range of motion and neck supple.     Right lower leg: No edema.     Left lower leg: No edema.  Skin:    General: Skin is warm and dry.     Findings: No lesion or rash.  Neurological:     General: No focal deficit present.     Mental Status: She is alert and oriented to person, place, and time.   Psychiatric:        Mood and Affect: Mood normal.        Behavior: Behavior normal.        Thought Content: Thought content normal.        Judgment: Judgment normal.        Assessment & Plan:  Encounter for annual physical exam Assessment & Plan: Age-appropriate screening and counseling performed today. Will check labs and call with results. Preventive measures discussed and printed in AVS for patient.   Patient Counseling: '[x]'$   Nutrition: Stressed importance of moderation in sodium/caffeine intake, saturated fat and cholesterol, caloric balance, sufficient intake of fresh fruits, vegetables, and fiber.  '[x]'$   Stressed the importance of regular exercise.   '[x]'$   Substance Abuse: Discussed cessation/primary prevention of tobacco, alcohol, or other drug use; driving or other dangerous activities under the influence; availability of treatment for abuse.   '[]'$   Injury prevention: Discussed safety belts, safety helmets, smoke detector, smoking near bedding or upholstery.   '[]'$   Sexuality: Discussed sexually transmitted diseases, partner selection, use of condoms, avoidance of unintended pregnancy  and contraceptive alternatives.   '[x]'$   Dental health: Discussed importance of regular tooth brushing, flossing, and dental visits.  '[x]'$   Health maintenance and immunizations reviewed. Please refer to Health maintenance section.      Orders: -     CBC with Differential/Platelet -     Comprehensive metabolic panel -     Lipid panel -     TSH  Stress at home -     Ambulatory referral to Psychology  Need for immunization against influenza -     Flu Vaccine QUAD 46moIM (Fluarix, Fluzone & Alfiuria Quad PF)       Return in about 1 year (around 07/06/2023) for CPE & fasting labs .     Christin Moline M Katrece Roediger, PA-C

## 2022-07-05 NOTE — Assessment & Plan Note (Signed)
Age-appropriate screening and counseling performed today. Will check labs and call with results. Preventive measures discussed and printed in AVS for patient.   Patient Counseling: [x]$   Nutrition: Stressed importance of moderation in sodium/caffeine intake, saturated fat and cholesterol, caloric balance, sufficient intake of fresh fruits, vegetables, and fiber.  [x]$   Stressed the importance of regular exercise.   [x]$   Substance Abuse: Discussed cessation/primary prevention of tobacco, alcohol, or other drug use; driving or other dangerous activities under the influence; availability of treatment for abuse.   []$   Injury prevention: Discussed safety belts, safety helmets, smoke detector, smoking near bedding or upholstery.   []$   Sexuality: Discussed sexually transmitted diseases, partner selection, use of condoms, avoidance of unintended pregnancy  and contraceptive alternatives.   [x]$   Dental health: Discussed importance of regular tooth brushing, flossing, and dental visits.  [x]$   Health maintenance and immunizations reviewed. Please refer to Health maintenance section.

## 2022-07-06 ENCOUNTER — Other Ambulatory Visit: Payer: Self-pay

## 2022-07-06 DIAGNOSIS — R17 Unspecified jaundice: Secondary | ICD-10-CM

## 2022-07-16 IMAGING — MG MM DIGITAL DIAGNOSTIC UNILAT*L* W/ TOMO W/ CAD
4 series · 4 of 12 positions shown · non-contrast
Comparison: Previous exams including recent screening mammogram
dated 02/16/2022 and earlier screening mammogram dated 05/10/2018.

CLINICAL DATA: Patient returns today to evaluate a possible LEFT
breast asymmetry questioned on recent screening mammogram.

EXAM:
DIGITAL DIAGNOSTIC UNILATERAL LEFT MAMMOGRAM WITH TOMOSYNTHESIS AND
CAD
TECHNIQUE: Left digital diagnostic mammography and breast tomosynthesis was
performed. The images were evaluated with computer-aided detection.

[L CC synth-2D]
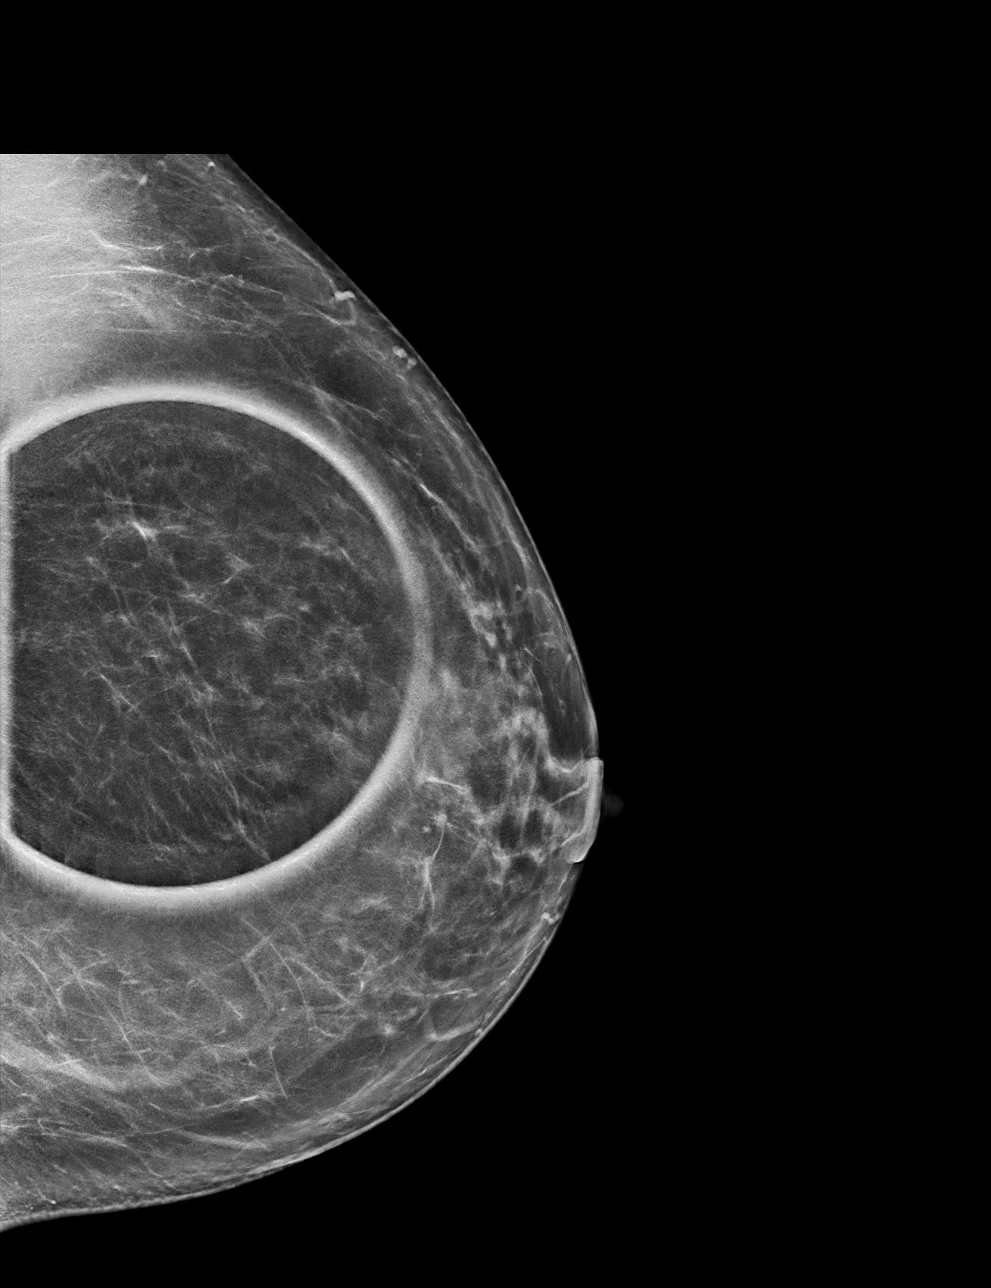

[L ML synth-2D]
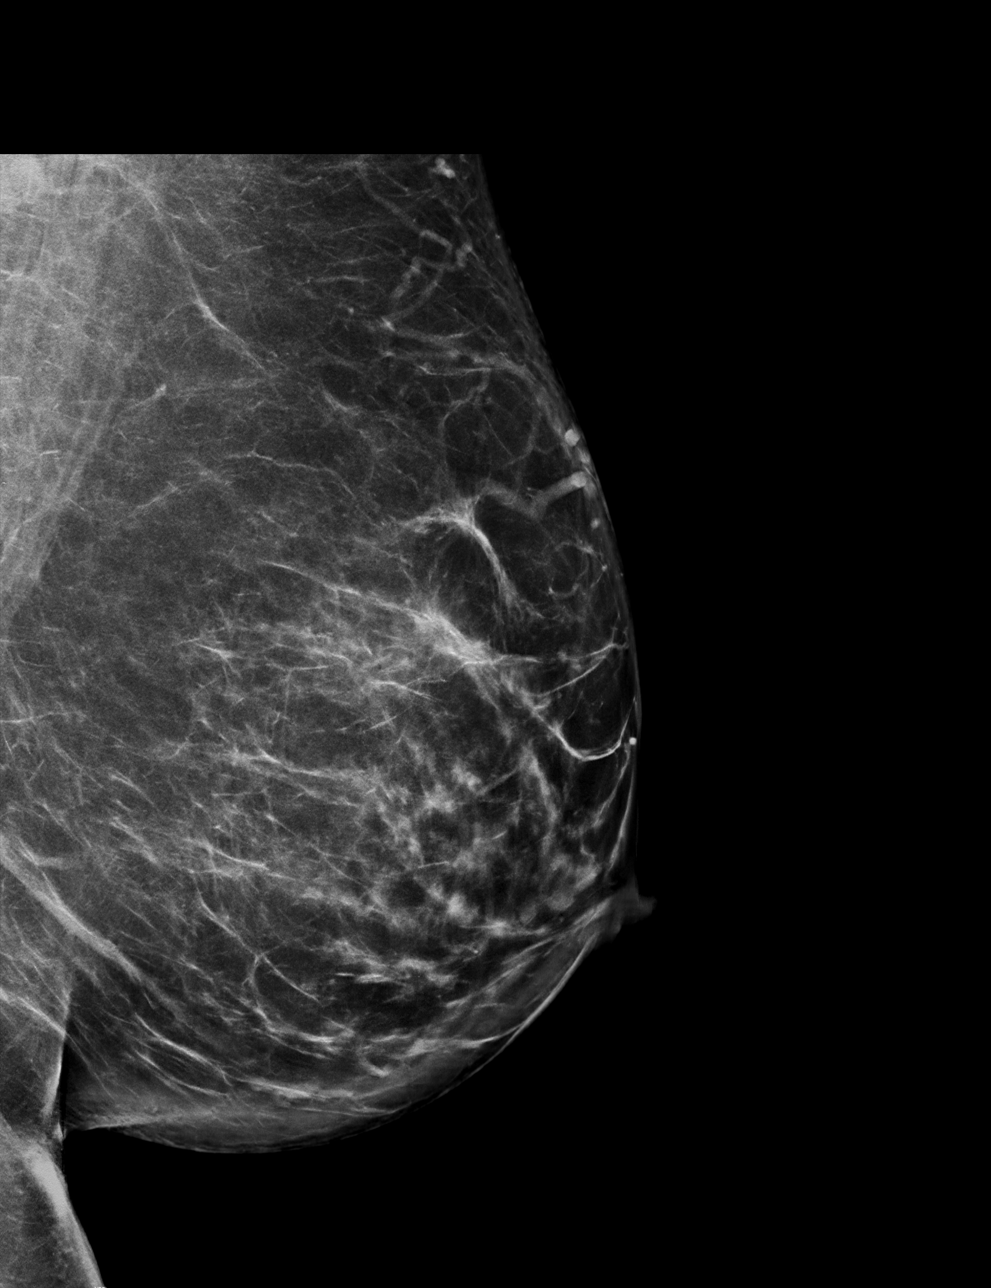

[L CC tomo · tomo slice 38/75.0]
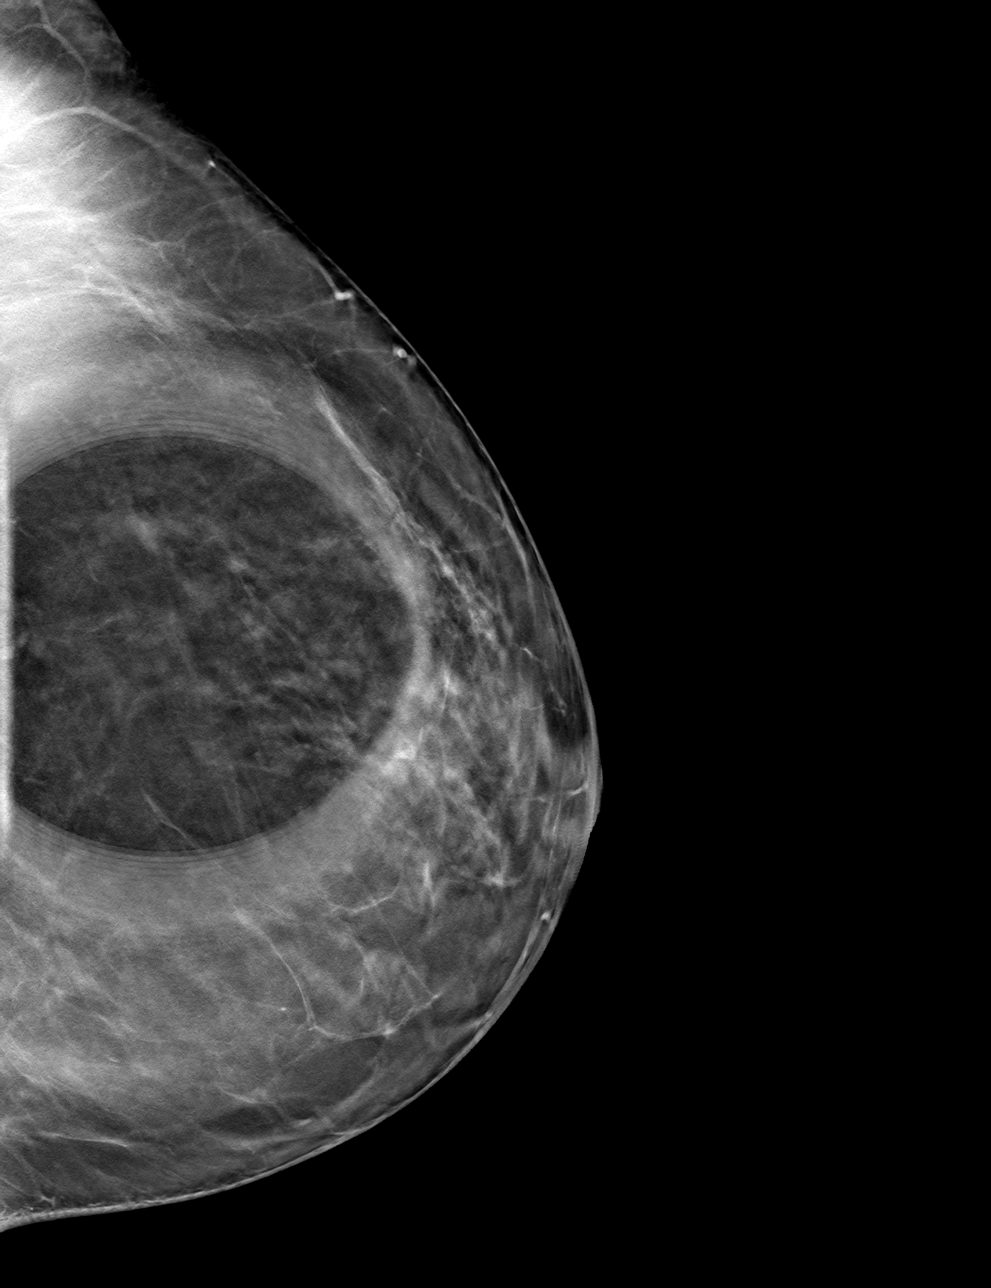

[L ML tomo · tomo slice 43/84.0]
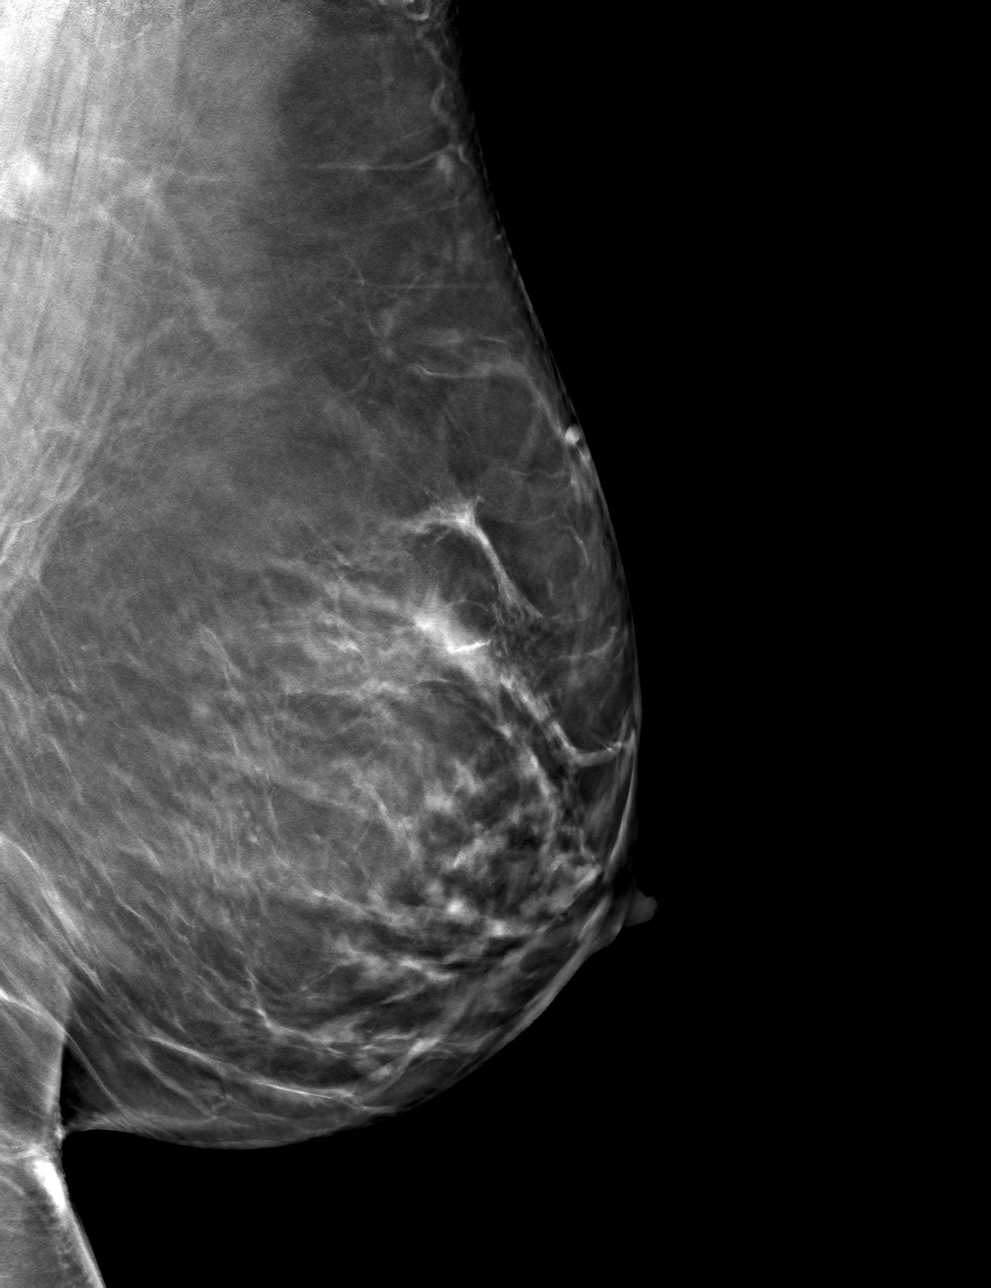

[4 of 12 positions shown; findings below may reference images not displayed]

ACR Breast Density Category c: The breast tissue is heterogeneously
dense, which may obscure small masses.
FINDINGS: On today's additional diagnostic views, including spot compression
with 3D tomosynthesis, there is no persistent asymmetry within the
outer LEFT breast indicating superimposition of normal
fibroglandular tissues.
IMPRESSION: No evidence of malignancy.

Patient may return to routine annual bilateral screening mammogram
schedule.

RECOMMENDATION:
Screening mammogram in one year.(Code:DA-H-ACF)

I have discussed the findings and recommendations with the patient.
If applicable, a reminder letter will be sent to the patient
regarding the next appointment.

BI-RADS CATEGORY  1: Negative.

## 2022-07-19 ENCOUNTER — Encounter: Payer: Self-pay | Admitting: Physician Assistant

## 2022-07-23 ENCOUNTER — Ambulatory Visit (HOSPITAL_BASED_OUTPATIENT_CLINIC_OR_DEPARTMENT_OTHER)
Admission: RE | Admit: 2022-07-23 | Discharge: 2022-07-23 | Disposition: A | Discharge status: Home / Self Care | Payer: BC Managed Care – PPO | Source: Ambulatory Visit | Attending: Physician Assistant | Admitting: Physician Assistant

## 2022-07-23 ENCOUNTER — Ambulatory Visit (HOSPITAL_BASED_OUTPATIENT_CLINIC_OR_DEPARTMENT_OTHER): Payer: BC Managed Care – PPO

## 2022-07-23 DIAGNOSIS — R17 Unspecified jaundice: Secondary | ICD-10-CM | POA: Diagnosis not present

## 2022-08-09 ENCOUNTER — Encounter: Payer: Self-pay | Admitting: Physician Assistant

## 2022-08-10 NOTE — Telephone Encounter (Signed)
Please see pt msg and advise if you would like her to stop OTC supplements at this time

## 2022-08-20 ENCOUNTER — Ambulatory Visit (INDEPENDENT_AMBULATORY_CARE_PROVIDER_SITE_OTHER): Payer: BC Managed Care – PPO | Admitting: Family

## 2022-08-20 ENCOUNTER — Encounter: Payer: Self-pay | Admitting: Family

## 2022-08-20 VITALS — BP 115/75 | HR 59 | Temp 97.8°F | Ht 68.0 in | Wt 187.2 lb

## 2022-08-20 DIAGNOSIS — J029 Acute pharyngitis, unspecified: Secondary | ICD-10-CM

## 2022-08-20 DIAGNOSIS — J069 Acute upper respiratory infection, unspecified: Secondary | ICD-10-CM

## 2022-08-20 LAB — POCT RAPID STREP A (OFFICE): Rapid Strep A Screen: NEGATIVE

## 2022-08-20 LAB — POCT INFLUENZA A/B
Influenza A, POC: NEGATIVE
Influenza B, POC: NEGATIVE

## 2022-08-20 NOTE — Progress Notes (Signed)
Patient ID: Desiree Reed, female    DOB: May 02, 1981, 41 y.o.   MRN: [id number]  Chief Complaint  Patient presents with   Sore Throat    Pt c/o Sore throat, fatigue, Nasal congestion, bilateral ear pressure , slight Dry cough and headaches, present since Wednesday. Negative Covid test this morning at home. Flu shot in Nov. Has tried Advil which did not help.    HPI:      URI sx:  sx started on Wednesday. Pt c/o Sore throat, fatigue, Nasal congestion, bilateral ear pressure , slight Dry cough and headaches, present since Wednesday. Negative Covid test this morning at home. Flu shot in Nov. Has tried Advil which did not help.   Assessment & Plan:  1. Sore throat rapid strep neg. Advised pt to take Ibuprofen '600mg'$  3 times per day prn for sore throat pain, swelling, and fever. Gargle with warm salt water several times per day. OK to use over the counter Chloraseptic spray and/or throat lozenges as needed. Drink plenty of water.   - POCT rapid strep A  2. Viral upper respiratory tract infection rapid flu neg.  Advised on Ibuprofen as above for sore throat, generic sudafed tid or claritin-D for sinus sx, nasal saline spray tid, humidifier overnight. Call back if sx are not improving in another week.    - POCT Influenza A/B  Subjective:    Outpatient Medications Prior to Visit  Medication Sig Dispense Refill   levonorgestrel (MIRENA, 52 MG,) 20 MCG/DAY IUD Provided by care center     valACYclovir (VALTREX) 500 MG tablet valacyclovir 500 mg tablet  TAKE 1 TABLET BY MOUTH EVERY DAY     No facility-administered medications prior to visit.   Past Medical History:  Diagnosis Date   AMA (advanced maternal age) primigravida 35+    Asthma    childhood   Herpes    PE (pulmonary embolism) 2013   No past surgical history on file. No Known Allergies    Objective:    Physical Exam Vitals and nursing note reviewed.  Constitutional:      Appearance: Normal appearance. She is  ill-appearing.     Interventions: Face mask in place.  HENT:     Right Ear: Tympanic membrane and ear canal normal.     Left Ear: Tympanic membrane and ear canal normal.     Nose:     Right Sinus: Frontal sinus tenderness present.     Left Sinus: Frontal sinus tenderness present.     Mouth/Throat:     Mouth: Mucous membranes are moist.     Pharynx: Posterior oropharyngeal erythema present. No pharyngeal swelling, oropharyngeal exudate or uvula swelling.     Tonsils: No tonsillar exudate or tonsillar abscesses.  Cardiovascular:     Rate and Rhythm: Normal rate and regular rhythm.  Pulmonary:     Effort: Pulmonary effort is normal.     Breath sounds: Normal breath sounds.  Musculoskeletal:        General: Normal range of motion.  Lymphadenopathy:     Head:     Right side of head: No preauricular or posterior auricular adenopathy.     Left side of head: No preauricular or posterior auricular adenopathy.     Cervical: No cervical adenopathy.  Skin:    General: Skin is warm and dry.  Neurological:     Mental Status: She is alert.  Psychiatric:        Mood and Affect: Mood normal.  Behavior: Behavior normal.    BP 115/75 (BP Location: Left Arm, Patient Position: Sitting, Cuff Size: Large)   Pulse (!) 59   Temp 97.8 F (36.6 C) (Temporal)   Ht '5\' 8"'$  (1.727 m)   Wt 187 lb 4 oz (84.9 kg)   SpO2 100%   BMI 28.47 kg/m  Wt Readings from Last 3 Encounters:  08/20/22 187 lb 4 oz (84.9 kg)  07/05/22 194 lb 3.2 oz (88.1 kg)  07/15/21 193 lb (87.5 kg)       Jeanie Sewer, NP

## 2022-08-24 NOTE — Telephone Encounter (Signed)
Called patient and advised provider recommendations, patient scheduled appt for 08/25/22 per Alyssa request

## 2022-08-25 ENCOUNTER — Encounter: Payer: Self-pay | Admitting: Physician Assistant

## 2022-08-25 ENCOUNTER — Ambulatory Visit (INDEPENDENT_AMBULATORY_CARE_PROVIDER_SITE_OTHER): Payer: BC Managed Care – PPO | Admitting: Physician Assistant

## 2022-08-25 VITALS — BP 118/66 | Temp 97.3°F | Ht 68.0 in | Wt 184.8 lb

## 2022-08-25 DIAGNOSIS — R051 Acute cough: Secondary | ICD-10-CM

## 2022-08-25 DIAGNOSIS — J0100 Acute maxillary sinusitis, unspecified: Secondary | ICD-10-CM

## 2022-08-25 LAB — POCT INFLUENZA A/B
Influenza A, POC: NEGATIVE
Influenza B, POC: NEGATIVE

## 2022-08-25 LAB — POC COVID19 BINAXNOW: SARS Coronavirus 2 Ag: NEGATIVE

## 2022-08-25 MED ORDER — PREDNISONE 20 MG PO TABS: 20.0000 mg | ORAL_TABLET | Freq: Two times a day (BID) | ORAL | 0 refills | Status: AC

## 2022-08-25 MED ORDER — AMOXICILLIN-POT CLAVULANATE 875-125 MG PO TABS: 1.0000 | ORAL_TABLET | Freq: Two times a day (BID) | ORAL | 0 refills | Status: AC

## 2022-08-25 MED ORDER — BENZONATATE 100 MG PO CAPS: 100.0000 mg | ORAL_CAPSULE | Freq: Three times a day (TID) | ORAL | 0 refills | Status: AC | PRN

## 2022-08-25 NOTE — Progress Notes (Signed)
Subjective:    Patient ID: Desiree Reed, female    DOB: Jan 30, 1981, 41 y.o.   MRN: [id number]  Chief Complaint  Patient presents with   Cough    Pt c/o cold symptoms since last Wednesday continuously tested negative for Covid; since then cough has came in and got worse. Still feeling fatigued, and head is still pounding with pressure;     Cough   Patient is in today for acute sick symptoms.  Chief complaint: Fatigue, congestion Symptom onset: 1 week ago  Pertinent positives: Coughing at night, sinus pressure, lack of appetite, body aches Pertinent negatives: Chest pain, SOB, n/v/d, abd pain Treatments tried: Ibuprofen, Dayquil  Vaccine status: UTD on flu and COVID vaccines  Sick exposure: No known sick exposures    Past Medical History:  Diagnosis Date   AMA (advanced maternal age) primigravida 35+    Asthma    childhood   Herpes    PE (pulmonary embolism) 2013    History reviewed. No pertinent surgical history.  Family History  Problem Relation Age of Onset   Cancer Mother    Ovarian cancer Mother    Diabetes Father        complications, leg amputation   Congestive Heart Failure Father    Cancer Father    Kidney disease Father    Dysmenorrhea Sister     Social History   Tobacco Use   Smoking status: Former    Packs/day: 0.00    Years: 10.00    Total pack years: 0.00    Types: Cigarettes    Quit date: 09/13/2012    Years since quitting: 9.9   Smokeless tobacco: Never  Substance Use Topics   Alcohol use: Yes    Alcohol/week: 4.0 standard drinks of alcohol    Types: 4 Glasses of wine per week    Comment: 2-3 glasses of wine a week   Drug use: No     No Known Allergies  Review of Systems  Respiratory:  Positive for cough.    NEGATIVE UNLESS OTHERWISE INDICATED IN HPI      Objective:     BP 118/66 (BP Location: Left Arm)   Temp (!) 97.3 F (36.3 C) (Temporal)   Ht '5\' 8"'$  (1.727 m)   Wt 184 lb 12.8 oz (83.8 kg)   SpO2 100%   BMI 28.10  kg/m   Wt Readings from Last 3 Encounters:  08/25/22 184 lb 12.8 oz (83.8 kg)  08/20/22 187 lb 4 oz (84.9 kg)  07/05/22 194 lb 3.2 oz (88.1 kg)    BP Readings from Last 3 Encounters:  08/25/22 118/66  08/20/22 115/75  07/05/22 102/66     Physical Exam Vitals and nursing note reviewed.  Constitutional:      General: She is not in acute distress.    Appearance: Normal appearance. She is not ill-appearing.  HENT:     Head: Normocephalic.     Right Ear: Tympanic membrane, ear canal and external ear normal.     Left Ear: Tympanic membrane, ear canal and external ear normal.     Nose: Congestion present.     Right Sinus: Maxillary sinus tenderness and frontal sinus tenderness present.     Left Sinus: Maxillary sinus tenderness and frontal sinus tenderness present.     Mouth/Throat:     Mouth: Mucous membranes are moist.     Pharynx: No oropharyngeal exudate or posterior oropharyngeal erythema.  Eyes:     Extraocular Movements: Extraocular movements intact.  Conjunctiva/sclera: Conjunctivae normal.     Pupils: Pupils are equal, round, and reactive to light.  Cardiovascular:     Rate and Rhythm: Normal rate and regular rhythm.     Pulses: Normal pulses.     Heart sounds: Normal heart sounds. No murmur heard. Pulmonary:     Effort: Pulmonary effort is normal. No respiratory distress.     Breath sounds: Normal breath sounds. No wheezing.  Musculoskeletal:     Cervical back: Normal range of motion.  Skin:    General: Skin is warm.     Findings: No lesion or rash.  Neurological:     Mental Status: She is alert and oriented to person, place, and time.  Psychiatric:        Mood and Affect: Mood normal.        Behavior: Behavior normal.        Assessment & Plan:  Acute maxillary sinusitis, recurrence not specified -     POC COVID-19 BinaxNow -     POCT Influenza A/B  Acute cough  Other orders -     Amoxicillin-Pot Clavulanate; Take 1 tablet by mouth 2 (two) times  daily for 7 days.  Dispense: 14 tablet; Refill: 0 -     predniSONE; Take 1 tablet (20 mg total) by mouth 2 (two) times daily with a meal for 5 days.  Dispense: 10 tablet; Refill: 0 -     Benzonatate; Take 1 capsule (100 mg total) by mouth 3 (three) times daily as needed for up to 7 days for cough.  Dispense: 20 capsule; Refill: 0   POC COVID and flu tests negative. Symptoms c/w acute sinusitis with cough. Treat with augmentin and prednisone as directed. Fluids, nasal saline, rest.     Return if symptoms worsen or fail to improve.  This note was prepared with assistance of Systems analyst. Occasional wrong-word or sound-a-like substitutions may have occurred due to the inherent limitations of voice recognition software.     Mikiah Demond M Stewart Pimenta, PA-C

## 2022-08-26 ENCOUNTER — Encounter: Payer: Self-pay | Admitting: Physician Assistant

## 2022-08-26 NOTE — Telephone Encounter (Signed)
Please see pt msg and advise 

## 2022-11-29 DIAGNOSIS — F4320 Adjustment disorder, unspecified: Secondary | ICD-10-CM | POA: Diagnosis not present

## 2022-12-20 DIAGNOSIS — F4320 Adjustment disorder, unspecified: Secondary | ICD-10-CM | POA: Diagnosis not present

## 2023-01-03 DIAGNOSIS — F4320 Adjustment disorder, unspecified: Secondary | ICD-10-CM | POA: Diagnosis not present

## 2023-01-26 DIAGNOSIS — F4320 Adjustment disorder, unspecified: Secondary | ICD-10-CM | POA: Diagnosis not present

## 2023-02-09 DIAGNOSIS — F4320 Adjustment disorder, unspecified: Secondary | ICD-10-CM | POA: Diagnosis not present

## 2023-03-14 ENCOUNTER — Encounter: Payer: Self-pay | Admitting: Physician Assistant

## 2023-06-02 ENCOUNTER — Other Ambulatory Visit: Payer: Self-pay | Admitting: Obstetrics and Gynecology

## 2023-06-02 DIAGNOSIS — R928 Other abnormal and inconclusive findings on diagnostic imaging of breast: Secondary | ICD-10-CM

## 2023-06-09 ENCOUNTER — Ambulatory Visit
Admission: RE | Admit: 2023-06-09 | Discharge: 2023-06-09 | Disposition: A | Discharge status: Home / Self Care | Payer: Managed Care, Other (non HMO) | Source: Ambulatory Visit | Attending: Obstetrics and Gynecology | Admitting: Obstetrics and Gynecology

## 2023-06-09 ENCOUNTER — Ambulatory Visit: Payer: BC Managed Care – PPO

## 2023-06-09 DIAGNOSIS — R928 Other abnormal and inconclusive findings on diagnostic imaging of breast: Secondary | ICD-10-CM | POA: Diagnosis not present

## 2023-12-19 DIAGNOSIS — F4323 Adjustment disorder with mixed anxiety and depressed mood: Secondary | ICD-10-CM | POA: Diagnosis not present

## 2024-01-05 DIAGNOSIS — F4323 Adjustment disorder with mixed anxiety and depressed mood: Secondary | ICD-10-CM | POA: Diagnosis not present

## 2024-01-18 DIAGNOSIS — F4323 Adjustment disorder with mixed anxiety and depressed mood: Secondary | ICD-10-CM | POA: Diagnosis not present

## 2024-04-23 ENCOUNTER — Ambulatory Visit: Payer: Self-pay

## 2024-04-23 NOTE — Telephone Encounter (Signed)
 Copied from CRM [id number]. Topic: Clinical - Red Word Triage >> Apr 23, 2024  2:19 PM Deaijah H wrote: Red Word that prompted transfer to Nurse Triage: Knee really swollen from previous fall Reason for Disposition  MILD or MODERATE swelling (e.g., can't move joint normally, can't do usual activities) (Exceptions: Itchy, localized swelling; swelling is chronic.)  Answer Assessment - Initial Assessment Questions 1. LOCATION: Where is the swelling located?  (e.g., left, right, both knees)     right 2. ONSET: When did the swelling start? Does it come and go, or is it there all the time?     2 weeks ago 3. SWELLING: How bad is the swelling? Or, How large is it? (e.g., mild, moderate, severe; size of localized swelling)      Yes - knee cap is fluid filled 4. PAIN: Is there any pain? If Yes, ask: How bad is it? (Scale 0-10; or none, mild, moderate, severe)     Not a lot 5. SETTING: Has there been any recent work, exercise or other activity that involved that part of the body?      Fell on knee 7. ASSOCIATED SYMPTOMS: Is there any pain or redness?     Swollen 8. OTHER SYMPTOMS: Do you have any other symptoms? (e.g., calf pain, chest pain, difficulty breathing, fever)     no  Protocols used: Knee Swelling-A-AH

## 2024-04-23 NOTE — Telephone Encounter (Signed)
 Please see msg regarding pt upcoming appt for knee injury and advise

## 2024-04-24 NOTE — Telephone Encounter (Signed)
 Called pt and lvm to return call; if patient returns call please advise PCP recommendations for upcoming appt; pt better served being seen at [facility] or [facility] walk in clinic due to orthopedic concerns.

## 2024-04-25 ENCOUNTER — Ambulatory Visit: Payer: Self-pay | Admitting: Family

## 2024-06-22 ENCOUNTER — Ambulatory Visit: Admitting: Family Medicine

## 2024-06-22 ENCOUNTER — Ambulatory Visit: Payer: Self-pay

## 2024-06-22 ENCOUNTER — Encounter: Payer: Self-pay | Admitting: Family Medicine

## 2024-06-22 VITALS — BP 112/80 | HR 61 | Temp 98.2°F | Wt 168.3 lb

## 2024-06-22 DIAGNOSIS — H00012 Hordeolum externum right lower eyelid: Secondary | ICD-10-CM

## 2024-06-22 MED ORDER — TOBRAMYCIN 0.3 % OP SOLN: 2.0000 [drp] | OPHTHALMIC | 0 refills | Status: DC

## 2024-06-22 NOTE — Telephone Encounter (Signed)
 Noted

## 2024-06-22 NOTE — Telephone Encounter (Signed)
 FYI Only or Action Required?: FYI only for provider.  Patient was last seen in primary care on 08/25/2022 by Allwardt, Mardy HERO, PA-C.  Called Nurse Triage reporting Belepharitis.  Symptoms began yesterday.  Interventions attempted: OTC medications: ibuprofen .  Symptoms are: gradually worsening.  Triage Disposition: See HCP Within 4 Hours (Or PCP Triage)  Patient/caregiver understands and will follow disposition?: Yes     Copied from CRM [id number]. Topic: Clinical - Red Word Triage >> Jun 22, 2024 10:33 AM [place] A wrote: [facility] that prompted transfer to Nurse Triage: Patient has Style under eyelid that is swollen , tender and red-sore to touch Reason for Disposition  [1] SEVERE eyelid swelling on one side AND [2] sinus pain or pressure  Answer Assessment - Initial Assessment Questions Pt states eye has been tender since Tuesday but yesterday afternoon noticed the swelling and pulled her eyelid down and saw the pimple like spot in her eye.   1. ONSET: When did the swelling start? (e.g., minutes, hours, days)     Swelling started happening yesterday afternoon 2. LOCATION: What part of the eyelid is swollen?     Lower eye lid 3. SEVERITY: How swollen is it? (e.g., describe; mild, moderate, or severe)     Moderate to severe 4. ITCHING: Is there any itching? If Yes, ask: How much?   (Scale 1-10; mild, moderate or severe)     No  5. PAIN: Is the swelling painful to touch? If Yes, ask: How painful is it?   (Scale 1-10; mild, moderate or severe)     3-4 6. FEVER: Do you have a fever? If Yes, ask: What is it, how was it measured, and when did it start?      No  7. CAUSE: What do you think is causing the swelling?     Thinks possible stye 8. RECURRENT SYMPTOM: Have you had eyelid swelling before? If Yes, ask: When was the last time? What happened that time?      9. OTHER SYMPTOMS: Do you have any other symptoms? (e.g., blurred vision, eye discharge,  rash, runny nose)     Drainage, bag under eye is red and extends into cheek. Has right ear pain. States the right side of her face feels sick  Protocols used: Eyelid Swelling-A-AH

## 2024-06-22 NOTE — Progress Notes (Signed)
 Established Patient Office Visit  Subjective   Patient ID: Desiree Reed, female    DOB: Jul 07, 1981  Age: 43 y.o. MRN: [id number]  Chief Complaint  Patient presents with   Facial Swelling    HPI   Desiree Reed is seen as a work in with some right eye swelling and little crusted drainage past couple days.  She noticed little bit of discoloration underneath the right lower lid yesterday.  Does not recall any injury.  She does wear contacts occasionally but has been wearing glasses recently.  Low bit of mild right ear pain.  No fever.  No visual changes.  She noticed what she thought was a stye right lower lid couple days ago.  Past Medical History:  Diagnosis Date   AMA (advanced maternal age) primigravida 35+    Asthma    childhood   Herpes    PE (pulmonary embolism) 2013   History reviewed. No pertinent surgical history.  reports that she quit smoking about 21 years ago. Her smoking use included cigarettes. She has never used smokeless tobacco. She reports current alcohol use of about 4.0 standard drinks of alcohol per week. She reports that she does not use drugs. family history includes Cancer in her father and mother; Congestive Heart Failure in her father; Diabetes in her father; Dysmenorrhea in her sister; Kidney disease in her father; Ovarian cancer in her mother. No Known Allergies  Review of Systems  Constitutional:  Negative for chills and fever.  Eyes:  Positive for pain and discharge. Negative for blurred vision.      Objective:     BP 112/80   Pulse 61   Temp 98.2 F (36.8 C) (Oral)   Wt 168 lb 4.8 oz (76.3 kg)   SpO2 97%   BMI 25.59 kg/m  BP Readings from Last 3 Encounters:  06/22/24 112/80  08/25/22 118/66  08/20/22 115/75   Wt Readings from Last 3 Encounters:  06/22/24 168 lb 4.8 oz (76.3 kg)  08/25/22 184 lb 12.8 oz (83.8 kg)  08/20/22 187 lb 4 oz (84.9 kg)      Physical Exam Vitals reviewed.  Constitutional:      General: She is not in acute  distress.    Appearance: She is not ill-appearing.  Eyes:     Comments: She has stye right lower lid which is currently opened up and draining some purulent like secretions.  Right conjunctive slightly erythematous.  She has a bit of crusted drainage on her right upper eyelid margin.  Does have a little bit of faint bruising just below the right lower lid.  No warmth.  No cellulitis changes.  Cardiovascular:     Rate and Rhythm: Normal rate and regular rhythm.  Musculoskeletal:     Cervical back: Neck supple.  Lymphadenopathy:     Cervical: No cervical adenopathy.  Neurological:     Mental Status: She is alert.      No results found for any visits on 06/22/24.    The 10-year ASCVD risk score (Arnett DK, et al., 2019) is: 0.2%    Assessment & Plan:   Patient has stye right lower lid which is draining at this time.  Has what appears to be a small bruise underneath the right lower lid but she does not recall any injury.  We recommend continued warm compresses several times daily and start Tobrex eyedrops 2 drops right eye every 4 hours while awake.  Follow-up for any persistent drainage or other concerns but this  should be clearing up soon   No follow-ups on file.    Wolm Scarlet, MD

## 2024-07-10 ENCOUNTER — Encounter: Payer: Self-pay | Admitting: Physician Assistant

## 2024-07-10 NOTE — Telephone Encounter (Signed)
 Please see pt questions regarding GLP using HERS app

## 2024-07-16 ENCOUNTER — Ambulatory Visit: Admitting: Physician Assistant

## 2024-07-16 ENCOUNTER — Ambulatory Visit: Payer: Self-pay

## 2024-07-16 VITALS — BP 118/80 | HR 56 | Temp 98.2°F | Ht 68.0 in | Wt 176.2 lb

## 2024-07-16 DIAGNOSIS — H029 Unspecified disorder of eyelid: Secondary | ICD-10-CM | POA: Diagnosis not present

## 2024-07-16 DIAGNOSIS — Z23 Encounter for immunization: Secondary | ICD-10-CM | POA: Diagnosis not present

## 2024-07-16 MED ORDER — AMOXICILLIN-POT CLAVULANATE 875-125 MG PO TABS: 1.0000 | ORAL_TABLET | Freq: Two times a day (BID) | ORAL | 0 refills | Status: DC

## 2024-07-16 NOTE — Telephone Encounter (Signed)
 FYI Only or Action Required?: FYI only for provider: appointment scheduled on 11/3.  Patient was last seen in primary care on 06/22/2024 by Micheal Wolm ORN, MD.  Called Nurse Triage reporting Belepharitis.  Symptoms began a week ago.  Interventions attempted: Ice/heat application.  Symptoms are: gradually worsening.  Triage Disposition: See HCP Within 4 Hours (Or PCP Triage)  Patient/caregiver understands and will follow disposition?: Yes  Copied from CRM [id number]. Topic: Clinical - Red Word Triage >> Jul 16, 2024  9:50 AM Larissa RAMAN wrote: [facility] that prompted transfer to Nurse Triage: lt eye cyst with pain and redness Reason for Disposition  [1] SEVERE eyelid swelling on one side AND [2] red and painful (or tender to touch)  Answer Assessment - Initial Assessment Questions 10/10- seen by Burchette for cyst on right eye. Given eye drops. Now she has one on the outside of her right eyelid. Warm compresses with no benefit.  Her aunt has Sjorgen's- wanting to mention in case it could be contributing  Appt made for today with PCP office to assess.  ED/UC/Call back precautions given and understood.   1. ONSET: When did the swelling start? (e.g., minutes, hours, days)     After the right cleared 2. LOCATION: What part of the eyelid is swollen?     Left eye 3. SEVERITY: How swollen is it? (e.g., describe; mild, moderate, or severe)     Not swollen shut, big red pussy looking bump on the outside of her eyelid. Right eye was under the eyelid.  4. ITCHING: Is there any itching? If Yes, ask: How much?   (Scale 1-10; mild, moderate or severe)     Mild  5. PAIN: Is the swelling painful to touch? If Yes, ask: How painful is it?   (Scale 1-10; mild, moderate or severe)     Stinging, throbbing pain- 4/10 6. FEVER: Do you have a fever? If Yes, ask: What is it, how was it measured, and when did it start?      denies 7. CAUSE: What do you think is causing the swelling?      Unsure  8. RECURRENT SYMPTOM: Have you had eyelid swelling before? If Yes, ask: When was the last time? What happened that time?     10/10 with the right eye  9. OTHER SYMPTOMS: Do you have any other symptoms? (e.g., blurred vision, eye discharge, rash, runny nose)     Runny nose but not related 10. PREGNANCY: Is there any chance you are pregnant? When was your last menstrual period?       denies  Protocols used: Eyelid Swelling-A-AH

## 2024-07-16 NOTE — Progress Notes (Signed)
 Desiree Reed is a 43 y.o. female here for a follow up of a pre-existing problem.  History of Present Illness:   Chief Complaint  Patient presents with   Eye Problem    Pt c/o cyst on left lower eye lid, started 4-5 days ago, warm compresses are not helping. Also still has cyst on inside right lower eyelid, saw Dr. Micheal on 10/10.    Discussed the use of AI scribe software for clinical note transcription with the patient, who gave verbal consent to proceed.  History of Present Illness   Desiree Reed is a 43 year old female who presents with recurrent styes on her eyelids.  Initially, a stye developed on her right lower eyelid, which was swollen and puffy. She was seen at another office on 10/10 and given Tobrex eye drops which reduced the swelling, but the stye did not fully resolve. Recently, a new painful stye appeared on her left upper eyelid, increasing in size. She has been using warm compresses and Tylenol  for pain relief, which provides temporary relief. She has not used eye drops recently due to concerns about contamination. The pain is described as throbbing and severe, affecting her willingness to go out.  She has a history of styes, though not as severe as the current ones. Her daughter had a stye recently, which resolved without recurrence. Preventive measures include washing pillowcases, limiting contact lens use, and avoiding eye makeup.  There are no changes in tearing or eye movement pain. Her eyes have been dry, and she is unsure if it is related to her current condition. Family history includes relatives with Sjogren's syndrome. She works from home, allowing her to manage her condition privately.       Past Medical History:  Diagnosis Date   AMA (advanced maternal age) primigravida 35+    Asthma    childhood   Herpes    PE (pulmonary embolism) 2013     Social History   Tobacco Use   Smoking status: Former    Current packs/day: 0.00    Types:  Cigarettes    Quit date: 09/13/2002    Years since quitting: 21.8   Smokeless tobacco: Never  Substance Use Topics   Alcohol use: Yes    Alcohol/week: 4.0 standard drinks of alcohol    Types: 4 Glasses of wine per week    Comment: 2-3 glasses of wine a week   Drug use: No    No past surgical history on file.  Family History  Problem Relation Age of Onset   Cancer Mother    Ovarian cancer Mother    Diabetes Father        complications, leg amputation   Congestive Heart Failure Father    Cancer Father    Kidney disease Father    Dysmenorrhea Sister     No Known Allergies  Current Medications:   Current Outpatient Medications:    levonorgestrel  (MIRENA , 52 MG,) 20 MCG/DAY IUD, Provided by care center, Disp: , Rfl:    tobramycin (TOBREX) 0.3 % ophthalmic solution, Place 2 drops into the right eye every 4 (four) hours., Disp: 5 mL, Rfl: 0   Review of Systems:   Negative unless otherwise specified per HPI.  Vitals:   Vitals:   07/16/24 1542  BP: 118/80  Pulse: (!) 56  Temp: 98.2 F (36.8 C)  TempSrc: Temporal  SpO2: 99%  Weight: 176 lb 4 oz (79.9 kg)  Height: 5' 8 (1.727 m)     Body mass  index is 26.8 kg/m.  Physical Exam:   Physical Exam Vitals and nursing note reviewed.  Constitutional:      General: She is not in acute distress.    Appearance: She is well-developed. She is not ill-appearing or toxic-appearing.  Eyes:     General:        Right eye: Hordeolum (resolving on lower, inner lid) present.   Cardiovascular:     Rate and Rhythm: Normal rate and regular rhythm.     Pulses: Normal pulses.     Heart sounds: Normal heart sounds, S1 normal and S2 normal.  Pulmonary:     Effort: Pulmonary effort is normal.     Breath sounds: Normal breath sounds.  Skin:    General: Skin is warm and dry.  Neurological:     Mental Status: She is alert.     GCS: GCS eye subscore is 4. GCS verbal subscore is 5. GCS motor subscore is 6.  Psychiatric:         Speech: Speech normal.        Behavior: Behavior normal. Behavior is cooperative.     Assessment and Plan:   Assessment and Plan    Eyelid abnormality No significant changes in tearing or vision. No signs of cellulitis or systemic infection. Considered Sjogren's syndrome due to family history, but no definitive diagnosis. - Prescribed oral Augmentin  antibiotics for 5-7 days, with a total of 10 days available if needed. - Advised continuation of warm compresses. - Recommended avoiding contact lenses and eye makeup. - Suggested taking ibuprofen  for pain and inflammation. - Instructed to monitor for fever, increased swelling, or inability to close eyelid, and report if these occur. - Advised taking pictures of the eyelid for documentation and potential eye doctor consultation. - Recommended follow-up with an eye doctor if symptoms persist or worsen.          Lucie Buttner, PA-C

## 2024-07-16 NOTE — Telephone Encounter (Signed)
 Noted

## 2024-07-26 ENCOUNTER — Encounter: Payer: Self-pay | Admitting: Physician Assistant

## 2024-08-20 ENCOUNTER — Encounter: Admitting: Physician Assistant

## 2024-10-04 ENCOUNTER — Ambulatory Visit: Payer: Self-pay

## 2024-10-04 NOTE — Telephone Encounter (Signed)
" ° °  Caller concerned that after her appt was made she saw white patches on the back of her throat. Questions if ok to wait until tomorrow. Swallowing and moving head normally. No rashes.  Advised can wait until tomorrow. She will call back with any further questions.  "

## 2024-10-04 NOTE — Telephone Encounter (Signed)
 Reviewed

## 2024-10-04 NOTE — Telephone Encounter (Signed)
 FYI Only or Action Required?: Action required by provider: request for appointment.  Patient was last seen in primary care on 07/16/2024 by Job Lukes, PA.  Called Nurse Triage reporting Sore Throat.  Symptoms began several days ago.  Interventions attempted: Rest, hydration, or home remedies.  Symptoms are: gradually worsening.Sore throat with swollen lyph node. Fatigue, headache, chills,urinary frequency.  Triage Disposition: See Physician Within 24 Hours  Patient/caregiver understands and will follow disposition?: YesReason for Triage: Patient c/o sore throat, headache, body aches, chills, severe fatigue, dry cough, no phlegm, nose stuffy, urinary incontinence yesterday. No chest pain, some breathing issues.Took at home COVID and Flu test but was negative.    Reason for Disposition  SEVERE throat pain (e.g., excruciating)  Answer Assessment - Initial Assessment Questions 1. ONSET: When did the throat start hurting? (Hours or days ago)      2 days 2. SEVERITY: How bad is the sore throat? (Scale 1-10; mild, moderate or severe)     MODERATE 3. STREP EXPOSURE: Has there been any exposure to strep within the past week? If Yes, ask: What type of contact occurred?      no 4.  VIRAL SYMPTOMS: Are there any symptoms of a cold, such as a runny nose, cough, hoarse voice or red eyes?      Body aches, fatigue, chills 5. FEVER: Do you have a fever? If Yes, ask: What is your temperature, how was it measured, and when did it start?     no 6. PUS ON THE TONSILS: Is there pus on the tonsils in the back of your throat?     no 7. OTHER SYMPTOMS: Do you have any other symptoms? (e.g., difficulty breathing, headache, rash)     Urinary frequency 8. PREGNANCY: Is there any chance you are pregnant? When was your last menstrual period?     no  Protocols used: Sore Throat-A-AH

## 2024-10-05 ENCOUNTER — Ambulatory Visit: Admitting: Family

## 2024-10-05 ENCOUNTER — Encounter: Payer: Self-pay | Admitting: Family

## 2024-10-05 VITALS — BP 110/70 | HR 72 | Temp 97.5°F | Ht 68.0 in | Wt 170.1 lb

## 2024-10-05 DIAGNOSIS — J020 Streptococcal pharyngitis: Secondary | ICD-10-CM

## 2024-10-05 LAB — POCT INFLUENZA A/B
Influenza A, POC: NEGATIVE
Influenza B, POC: NEGATIVE

## 2024-10-05 LAB — POCT RAPID STREP A (OFFICE): Rapid Strep A Screen: POSITIVE — AB

## 2024-10-05 LAB — POC COVID19 BINAXNOW: SARS Coronavirus 2 Ag: NEGATIVE

## 2024-10-05 MED ORDER — AMOXICILLIN 500 MG PO CAPS: 500.0000 mg | ORAL_CAPSULE | Freq: Two times a day (BID) | ORAL | 0 refills | Status: AC

## 2024-10-05 NOTE — Progress Notes (Signed)
 "  Patient ID: Desiree Reed, female    DOB: Aug 16, 1981, 44 y.o.   MRN: [id number]  Chief Complaint  Patient presents with   Sore Throat    Pt c/o sore throat, headache,dry cough and fatigue, present since Tuesday. Has tried ibuprofen  and nyquil, which did help slightly.   Discussed the use of AI scribe software for clinical note transcription with the patient, who gave verbal consent to proceed.  History of Present Illness Desiree Reed is a 44 year old female who presents with strep throat.  She has a painful sore throat with difficulty swallowing, worse at night, only partially relieved by frequent ibuprofen . She recently developed a nonproductive cough described as throat irritation with an itchy, catching sensation. She had a fever over 100F, which is elevated compared with her usual 96-61F baseline. She asks if she should take her next scheduled weight loss injection dose on Sunday while she is ill. She has no known drug allergies.  Assessment & Plan Streptococcal pharyngitis Acute streptococcal pharyngitis diagnosed clinically. COVID and flu tests negative. OK to continue GLP-1 medication if no nausea. - Prescribed amoxicillin  500 mg twice daily for 10 days. - Advised to complete full course of antibiotics. - Recommended ibuprofen  up to 800 mg three times a day for pain management, with food. - Encouraged use of humidifier to alleviate throat irritation. - Advised warm salt water gargles prn. - Instructed to maintain hydration and consider over-the-counter sinus cold medicine if needed. - Advised contagious for 24 hours after starting antibiotics.  Subjective:    Outpatient Medications Prior to Visit  Medication Sig Dispense Refill   levonorgestrel  (MIRENA , 52 MG,) 20 MCG/DAY IUD Provided by care center     tobramycin  (TOBREX ) 0.3 % ophthalmic solution Place 2 drops into the right eye every 4 (four) hours. 5 mL 0   valACYclovir  (VALTREX ) 500 MG tablet TAKE 1 TABLET BY  MOUTH TWICE DAILY FOR 3 DAYS THEN TAKE 1 TABLET BY MOUTH EVERY DAY FOR PREVENTION     amoxicillin -clavulanate (AUGMENTIN ) 875-125 MG tablet Take 1 tablet by mouth 2 (two) times daily. 20 tablet 0   No facility-administered medications prior to visit.   Past Medical History:  Diagnosis Date   AMA (advanced maternal age) primigravida 35+    Asthma    childhood   Herpes    PE (pulmonary embolism) 2013   History reviewed. No pertinent surgical history. Allergies[1]    Objective:    Physical Exam Vitals and nursing note reviewed.  Constitutional:      Appearance: Normal appearance. She is ill-appearing.     Interventions: Face mask in place.  HENT:     Right Ear: Tympanic membrane and ear canal normal.     Left Ear: Tympanic membrane and ear canal normal.     Nose:     Right Sinus: No frontal sinus tenderness.     Left Sinus: No frontal sinus tenderness.     Mouth/Throat:     Mouth: Mucous membranes are moist.     Pharynx: Posterior oropharyngeal erythema present. No pharyngeal swelling, oropharyngeal exudate or uvula swelling.     Tonsils: Tonsillar exudate present. No tonsillar abscesses. 2+ on the right. 2+ on the left.  Cardiovascular:     Rate and Rhythm: Normal rate and regular rhythm.  Pulmonary:     Effort: Pulmonary effort is normal.     Breath sounds: Normal breath sounds.  Musculoskeletal:        General: Normal range of motion.  Lymphadenopathy:     Head:     Right side of head: No preauricular or posterior auricular adenopathy.     Left side of head: No preauricular or posterior auricular adenopathy.     Cervical: Cervical adenopathy present.     Left cervical: Superficial cervical adenopathy present.  Skin:    General: Skin is warm and dry.  Neurological:     Mental Status: She is alert.  Psychiatric:        Mood and Affect: Mood normal.        Behavior: Behavior normal.    BP 110/70 (BP Location: Left Arm, Patient Position: Sitting, Cuff Size: Normal)    Pulse 72   Temp (!) 97.5 F (36.4 C) (Temporal)   Ht 5' 8 (1.727 m)   Wt 170 lb 2 oz (77.2 kg)   SpO2 99%   BMI 25.87 kg/m  Wt Readings from Last 3 Encounters:  10/05/24 170 lb 2 oz (77.2 kg)  07/16/24 176 lb 4 oz (79.9 kg)  06/22/24 168 lb 4.8 oz (76.3 kg)      Ed Mandich, NP     [1] No Known Allergies  "
